# Patient Record
Sex: Female | Born: 1935
Health system: Southern US, Community
[De-identification: ages and names within clinical notes are randomized; demographics above are authoritative.]

## PROBLEM LIST (undated history)

## (undated) DIAGNOSIS — I701 Atherosclerosis of renal artery: Secondary | ICD-10-CM

## (undated) DIAGNOSIS — R002 Palpitations: Secondary | ICD-10-CM

## (undated) DIAGNOSIS — I10 Essential (primary) hypertension: Secondary | ICD-10-CM

## (undated) DIAGNOSIS — Z9289 Personal history of other medical treatment: Secondary | ICD-10-CM

## (undated) DIAGNOSIS — I493 Ventricular premature depolarization: Secondary | ICD-10-CM

## (undated) DIAGNOSIS — R011 Cardiac murmur, unspecified: Secondary | ICD-10-CM

## (undated) HISTORY — DX: Palpitations: R00.2

## (undated) HISTORY — DX: Atherosclerosis of renal artery: I70.1

## (undated) HISTORY — DX: Ventricular premature depolarization: I49.3

## (undated) HISTORY — DX: Cardiac murmur, unspecified: R01.1

## (undated) HISTORY — DX: Personal history of other medical treatment: Z92.89

## (undated) HISTORY — PX: ROTATOR CUFF REPAIR: SHX139

## (undated) HISTORY — DX: Essential (primary) hypertension: I10

## (undated) HISTORY — PX: CARDIAC CATHETERIZATION: SHX172

---

## 1986-08-28 HISTORY — PX: TUBAL LIGATION: SHX77

## 1986-08-28 HISTORY — PX: CHOLECYSTECTOMY OPEN: SUR202

## 2004-11-25 ENCOUNTER — Ambulatory Visit: Payer: Self-pay | Admitting: *Deleted

## 2004-11-25 ENCOUNTER — Ambulatory Visit (HOSPITAL_COMMUNITY): Admission: RE | Admit: 2004-11-25 | Discharge: 2004-11-25 | Payer: Self-pay | Admitting: *Deleted

## 2006-03-12 IMAGING — CR DG CHEST 2V
2 series · 2 of 2 positions shown · non-contrast
Comparison: None in our system.

CLINICAL DATA: Abnormal cardiac stress test, chest pain.

[view not recorded (1 of 2)]
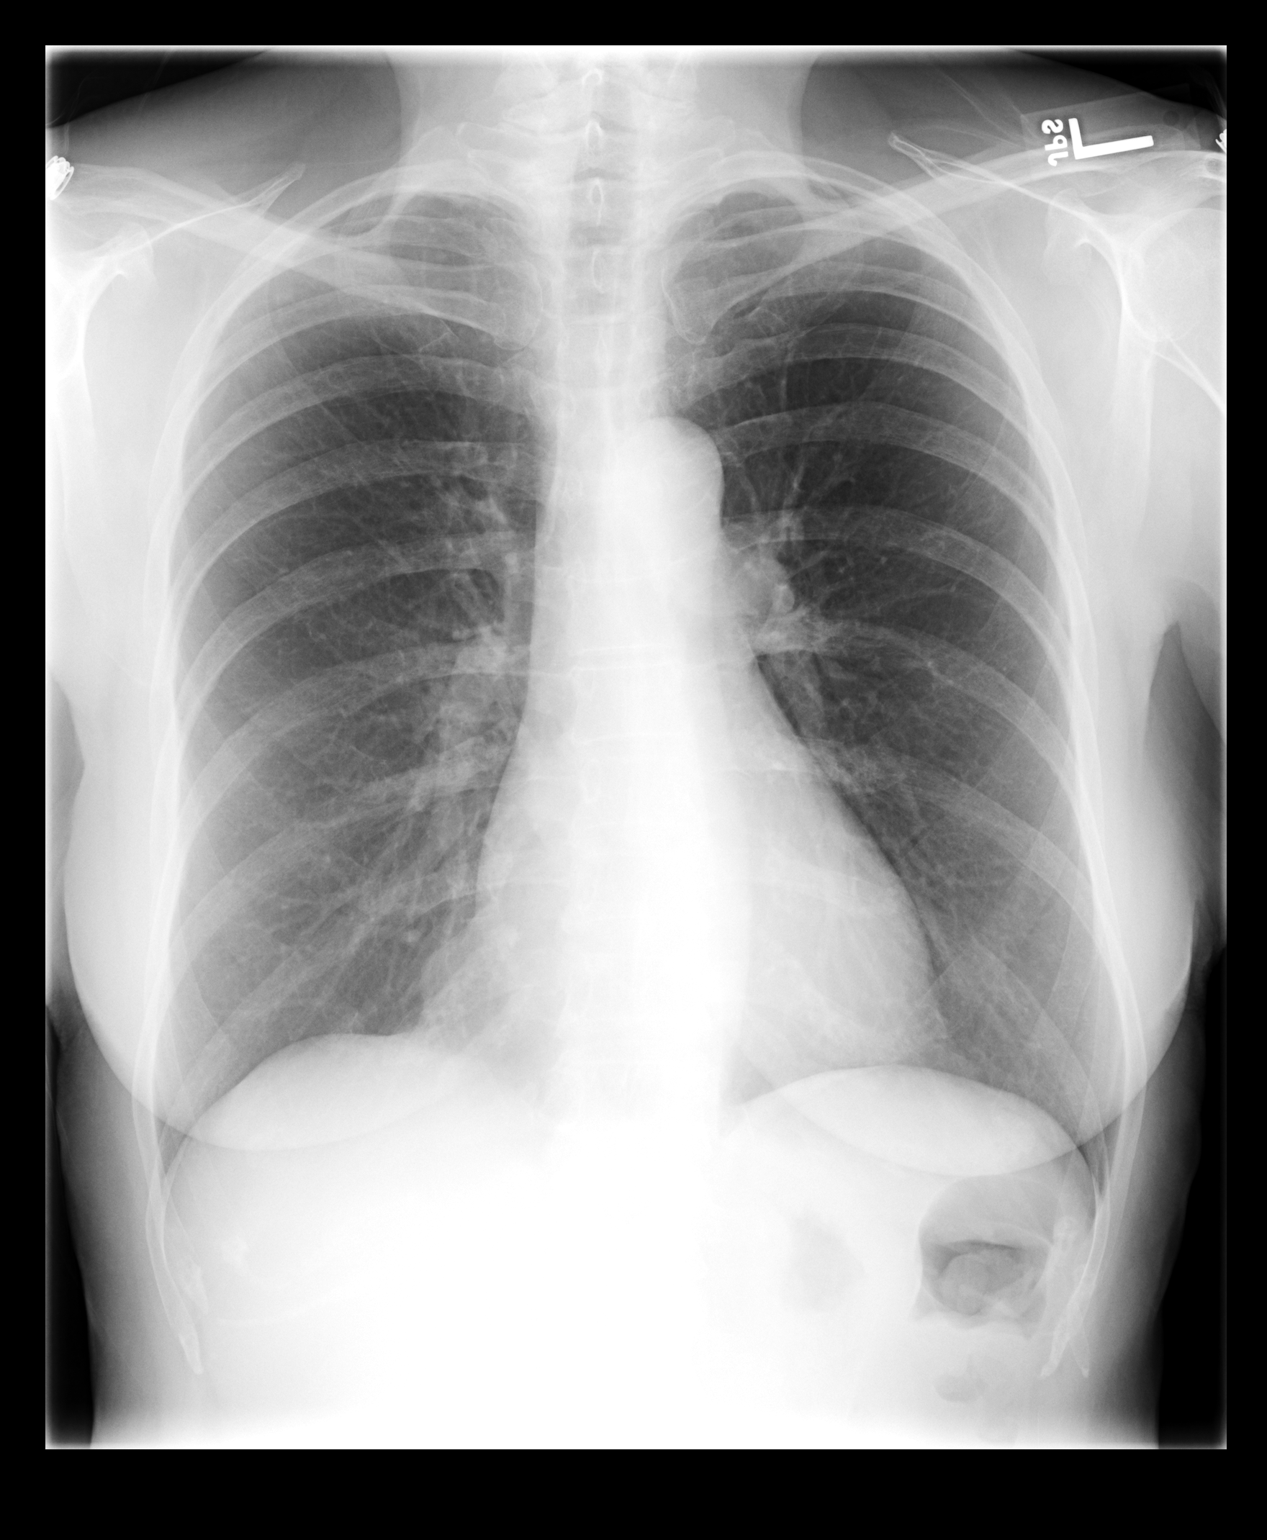

[view not recorded (2 of 2)]
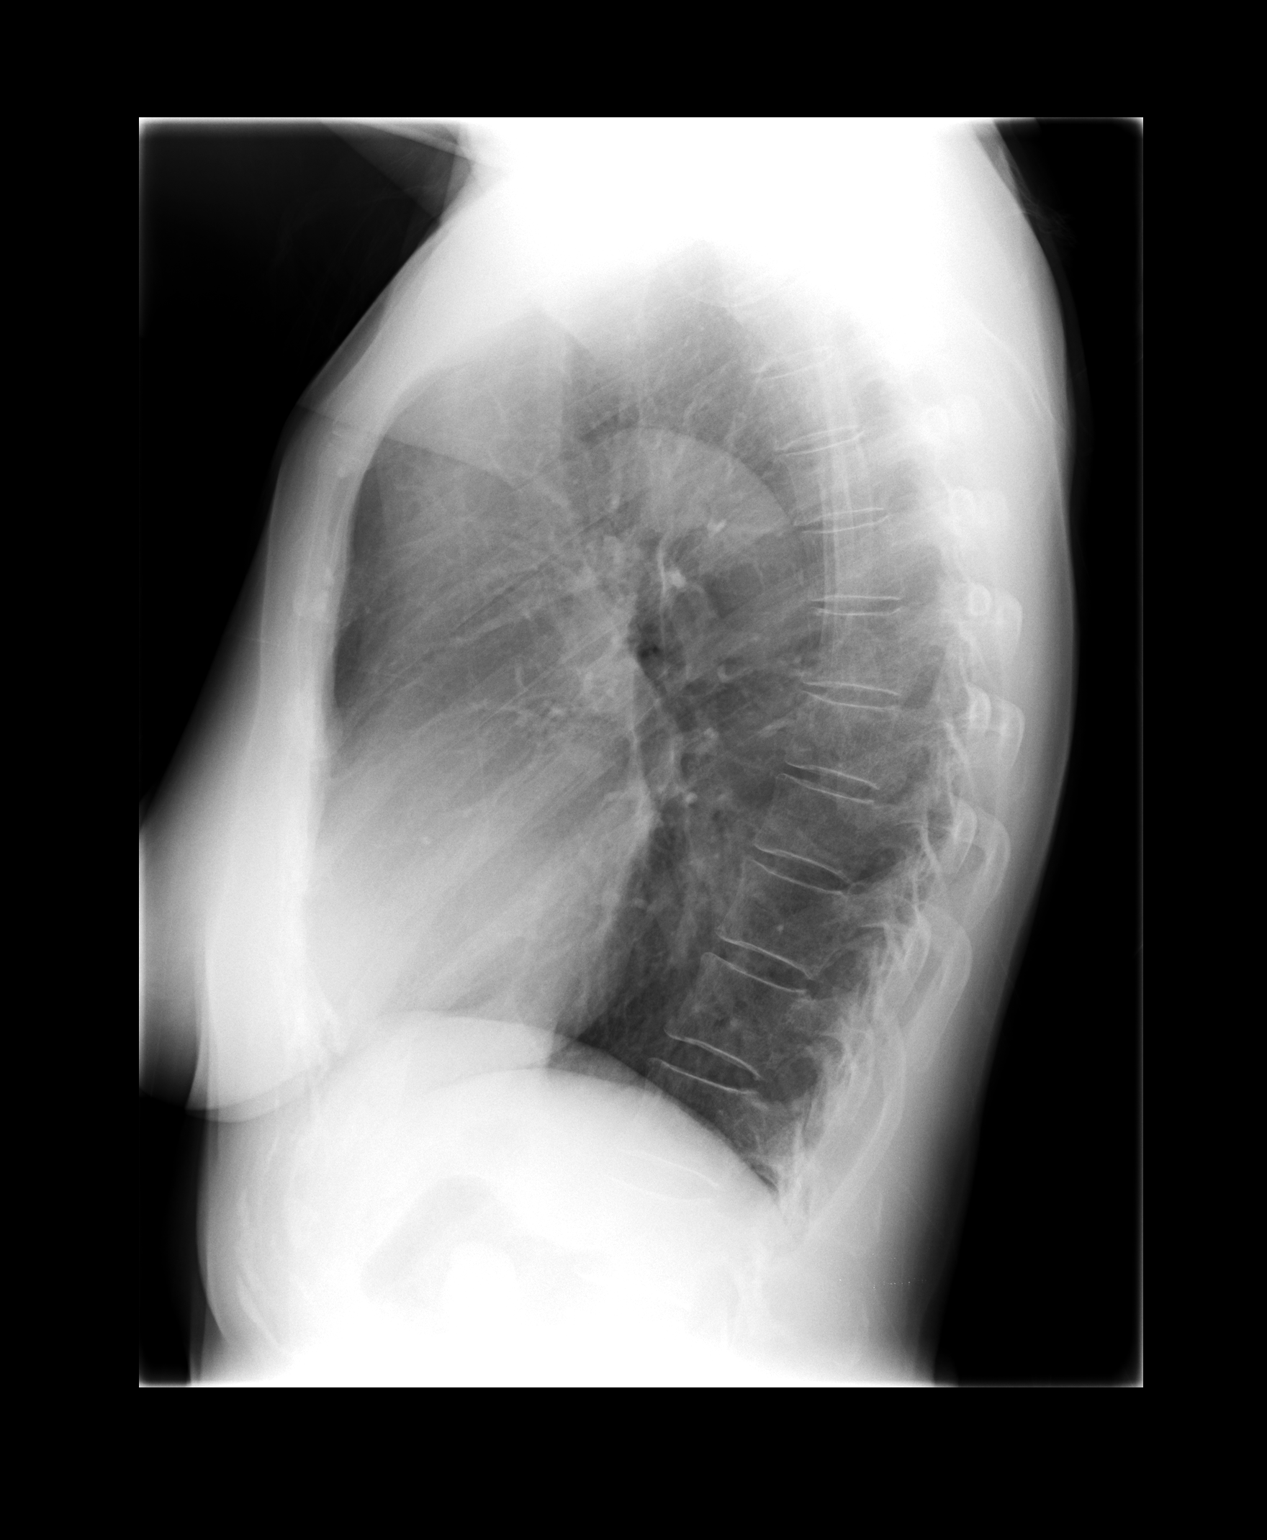

[2 of 2 positions shown; findings below may reference images not displayed]

CHEST - 2 VIEWS:
 There is an abnormal density at the right pericardiophrenic angle with convex borders to the right.  There is no corresponding abnormality on the lateral view although it may be obscured by the cardiac silhouette.  The heart is normal in size.  The lungs are clear.  No pneumothoraces or effusions are seen.
IMPRESSION: Abnormal mediastinal contour at the right pericardiophrenic angle.  Mediastinal mass is not excluded.  A comparison with prior films would be helpful.  If none are available, CT is recommended.

## 2007-08-29 HISTORY — PX: CYSTOCELE REPAIR: SHX163

## 2007-08-29 HISTORY — PX: VAGINAL HYSTERECTOMY: SUR661

## 2009-08-28 HISTORY — PX: BREAST SURGERY: SHX581

## 2010-10-10 ENCOUNTER — Ambulatory Visit (INDEPENDENT_AMBULATORY_CARE_PROVIDER_SITE_OTHER): Payer: Medicare Other | Admitting: Cardiovascular Disease

## 2010-10-10 DIAGNOSIS — I10 Essential (primary) hypertension: Secondary | ICD-10-CM

## 2010-10-10 DIAGNOSIS — I4949 Other premature depolarization: Secondary | ICD-10-CM

## 2010-10-10 DIAGNOSIS — R002 Palpitations: Secondary | ICD-10-CM

## 2010-11-09 ENCOUNTER — Encounter: Payer: Self-pay | Admitting: Cardiovascular Disease

## 2010-11-18 NOTE — Letter (Signed)
October 10, 2010   Dr. Feliciana Rossetti [address] [place], [place]  [address]  RE:  Stefanie, Ferguson MRN:  [id number]  /  DOB:  1935-08-03  Dear Dr. Shary Decamp:  I had the pleasure of seeing Stefanie Ferguson in the cardiology clinic to establish as a new patient.  She is transferring her care from [facility].  She is a pleasant 76 year old female with the following problem list: 1. History of symptomatic premature ventricular contractions with     palpitations. 2. Previous chest pain with normal coronary angiography more than 5     years ago. 3. Hypertension. 4. Renal artery stenosis being treated medically.  HISTORY OF PRESENT ILLNESS:  The patient is here today to establish cardiovascular care.  Overall she has been doing well recently.  She has a prolonged history of palpitations which was thought to be due to PVCs. There has been no documented atrial fibrillation.  She has been treated with a beta-blocker with acebutolol and overall has been doing reasonably well.  Her current palpitations are not frequent.  There has been no syncope or presyncope.  She denies any chest pain or dyspnea. She continues to be very active and has no significant physical limitations.  She does have hypertension but overall her blood pressure has been reasonably controlled with current medications.  She did not take her morning medications yet and that probably explains her high reading now.  MEDICATIONS: 1. Multivitamin once daily. 2. Hydrochlorothiazide 25 mg once daily. 3. Lisinopril 20 mg half a tablet twice daily. 4. Acebutolol 200 mg once daily. 5. Aspirin 325 mg once daily. 6. Prempro 0.3 mg once daily. 7. Nasonex nasal spray.  ALLERGIES:  No known drug allergies.  SOCIAL HISTORY:  Negative for smoking, alcohol or recreational drug use. The patient is widowed and lives by herself.  She is a retired [employer] fund[employer].  She exercises by walking.  PAST SURGICAL HISTORY:  Includes cholecystectomy,  tubal ligation, rotator cuff repair, cystocele resection, vaginal hysterectomy, resection of adenoma in the breast.  FAMILY HISTORY:  There is no family history of premature coronary artery disease.  Father died at the age of 77 from thoracic aortic aneurysm rupture.  Mother died at the age of 81.  REVIEW OF SYSTEMS:  This is remarkable for palpitations as outlined above.  Otherwise a full review of system was performed and is negative.  PHYSICAL EXAMINATION:  GENERAL:  The patient appears to be younger than her stated age and in no acute distress. VITAL SIGNS:  Weight is 130.4 pounds, blood pressure is 151/85, pulse is 60, oxygen saturation is 97% on room air. HEENT:  Normocephalic, atraumatic. NECK:  No JVD or carotid bruits. RESPIRATORY:  Normal respiratory effort with no use of accessory muscles.  Auscultation reveals normal breath sounds. CARDIOVASCULAR:  Normal PMI.  Normal S1 and S2 with no gallops or murmurs.  Heart rate is slightly irregular. ABDOMEN:  Benign, nontender, nondistended. EXTREMITIES:  With no clubbing, cyanosis or edema. SKIN:  Warm and dry with no rash. PSYCHIATRIC:  She is alert, oriented x3 with normal mood and affect. MUSCULOSKELETAL:  There is normal muscle strength in the upper and lower extremities.  An electrocardiogram was performed which showed normal sinus rhythm with sinus arrhythmia.  There are nonspecific ST and T-wave changes.  IMPRESSION: 1. Palpitation seems to be due to premature ventricular contractions.     Most recent Holter monitor was done in 2009 which showed a total of     6750  beats of PVCs over 48 hours.  There was no evidence of atrial     fibrillation at that time.  Her symptoms seem to be reasonably     controlled with acebutolol which will be continued.  She is known     to have normal LV systolic function. 2. Hypertension:  Blood pressure is elevated but she has not taken her     blood pressure medications today.  According  to the patient her     home blood pressure readings are less than 140 systolic.  Thus we     will continue with current medications.  The patient will follow up     in 6 months from now or earlier if needed.   Sincerely,     Lorine Bears, MD Electronically Signed   MA/MedQ  DD: 10/10/2010  DT: 10/10/2010  Job #: [id number]

## 2011-02-23 ENCOUNTER — Encounter: Payer: Self-pay | Admitting: Cardiovascular Disease

## 2011-02-23 ENCOUNTER — Ambulatory Visit (INDEPENDENT_AMBULATORY_CARE_PROVIDER_SITE_OTHER): Payer: Medicare Other | Admitting: Cardiovascular Disease

## 2011-02-23 DIAGNOSIS — I493 Ventricular premature depolarization: Secondary | ICD-10-CM | POA: Insufficient documentation

## 2011-02-23 DIAGNOSIS — I10 Essential (primary) hypertension: Secondary | ICD-10-CM

## 2011-02-23 DIAGNOSIS — I4949 Other premature depolarization: Secondary | ICD-10-CM

## 2011-02-23 NOTE — Assessment & Plan Note (Signed)
Her symptoms are well-controlled with acebutolol. She has not had any other documented arrhythmia. Continue current treatment.

## 2011-02-23 NOTE — Patient Instructions (Signed)
Your physician recommends that you schedule a follow-up appointment in: 6 months  

## 2011-02-23 NOTE — Progress Notes (Signed)
HPI  This is a 76 year old female who is here today for a followup visit. She has history of symptomatic PVCs well-controlled with acebutolol. She also has history of hypertension with known history of renal artery stenosis noted on renal duplex study most recently in 2009. The patient has been doing reasonably well. She has mild palpitations but no tachycardia. She denies any chest pain or dyspnea. She had problems with low blood pressure recently. She cut down the dose of lisinopril. I cut down the dose of hydrochlorothiazide to 12.5 mg once daily. However, the patient stopped the medication altogether.  No Known Allergies   Current Outpatient Prescriptions on File Prior to Visit  Medication Sig Dispense Refill  . acebutolol (SECTRAL) 200 MG capsule Take 200 mg by mouth daily.        Marland Kitchen aspirin 81 MG tablet Take 81 mg by mouth daily.        Marland Kitchen estrogen, conjugated,-medroxyprogesterone (PREMPRO) 0.3-1.5 MG per tablet Take 1 tablet by mouth daily.        Marland Kitchen lisinopril (PRINIVIL,ZESTRIL) 20 MG tablet Take 10 mg by mouth daily. Take 1/2 tablet daily       . mometasone (NASONEX) 50 MCG/ACT nasal spray 2 sprays by Nasal route daily.        . Multiple Vitamin (MULTIVITAMIN) capsule Take 1 capsule by mouth daily.        . hydrochlorothiazide 25 MG tablet Take 12.5 mg by mouth daily.          Past Medical History  Diagnosis Date  . Chest pain     normal coronary agniography in 2006 or 2007  . Symptomatic PVCs     with palpitations  . Heart palpitations     caused by PVC's  . Heart murmur   . Premature ventricular contractions   . Renal artery stenosis   . Hypertension      Past Surgical History  Procedure Date  . Cardiac catheterization   . Rotator cuff repair 1996/2000    bilateral  . Cystocele repair 2009    and Rectocele  . Vaginal hysterectomy 2009  . Cholecystectomy open 1988  . Tubal ligation 1988  . Breast surgery 2011    excision papilloma left breast     Family History    Problem Relation Age of Onset  . Aneurysm Father 50    thoracic aortic aneurysm     History   Social History  . Marital Status: Widowed    Spouse Name: N/A    Number of Children: N/A  . Years of Education: N/A   Occupational History  . Not on file.   Social History Main Topics  . Smoking status: Never Smoker   . Smokeless tobacco: Not on file  . Alcohol Use: No  . Drug Use: No  . Sexually Active:    Other Topics Concern  . Not on file   Social History Narrative  . No narrative on file       PHYSICAL EXAM   BP 151/84  Pulse 93  Ht 5\' 4"  (1.626 m)  Wt 131 lb (59.421 kg)  BMI 22.49 kg/m2  SpO2 96%  Constitutional: She is oriented to person, place, and time. She appears well-developed and well-nourished. No distress.  HENT: No nasal discharge.  Head: Normocephalic and atraumatic.  Eyes: Pupils are equal, round, and reactive to light. Right eye exhibits no discharge. Left eye exhibits no discharge.  Neck: Normal range of motion. Neck supple. No JVD present. No  thyromegaly present.  Cardiovascular: Normal rate, regular rhythm with premature beats., normal heart sounds and intact distal pulses. Exam reveals no gallop and no friction rub.  No murmur heard.  Pulmonary/Chest: Effort normal and breath sounds normal. No stridor. No respiratory distress. She has no wheezes. She has no rales. She exhibits no tenderness.  Abdominal: Soft. Bowel sounds are normal. She exhibits no distension. There is no tenderness. There is no rebound and no guarding.  Musculoskeletal: Normal range of motion. She exhibits no edema and no tenderness.  Neurological: She is alert and oriented to person, place, and time. Coordination normal.  Skin: Skin is warm and dry. No rash noted. She is not diaphoretic. No erythema. No pallor.  Psychiatric: She has a normal mood and affect. Her behavior is normal. Judgment and thought content normal.      ASSESSMENT AND PLAN

## 2011-02-23 NOTE — Assessment & Plan Note (Signed)
Her blood pressure is elevated today. I asked her to resume hydrochlorothiazide 12.5 mg once daily. Continue lisinopril and acebutolol. She will continue to monitor her blood pressure at home. She is going to obtain a new blood pressure cuff. If her blood pressure gets controlled without any evidence of chronic kidney disease, I don't see an indication to follow up on renal artery stenosis.

## 2011-04-20 ENCOUNTER — Ambulatory Visit: Payer: Medicare Other | Admitting: Cardiovascular Disease

## 2011-04-24 ENCOUNTER — Encounter: Payer: Self-pay | Admitting: Cardiovascular Disease

## 2011-04-24 ENCOUNTER — Ambulatory Visit (INDEPENDENT_AMBULATORY_CARE_PROVIDER_SITE_OTHER): Payer: Medicare Other | Admitting: Cardiovascular Disease

## 2011-04-24 DIAGNOSIS — I701 Atherosclerosis of renal artery: Secondary | ICD-10-CM

## 2011-04-24 DIAGNOSIS — I4949 Other premature depolarization: Secondary | ICD-10-CM

## 2011-04-24 DIAGNOSIS — I10 Essential (primary) hypertension: Secondary | ICD-10-CM

## 2011-04-24 DIAGNOSIS — I493 Ventricular premature depolarization: Secondary | ICD-10-CM

## 2011-04-24 MED ORDER — LISINOPRIL 20 MG PO TABS: 20.0000 mg | ORAL_TABLET | Freq: Every day | ORAL | Status: DC

## 2011-04-24 NOTE — Patient Instructions (Addendum)
Your physician recommends that you schedule a follow-up appointment in: 6 months in [facility]  Your physician has recommended you make the following change in your medication: INCREASE Lisinopril to 20 mg daily  Your physician has requested that you have a renal artery duplex. During this test, an ultrasound is used to evaluate blood flow to the kidneys. Allow one hour for this exam. Do not eat after midnight the day before and avoid carbonated beverages. Take your medications as you usually do.

## 2011-04-24 NOTE — Assessment & Plan Note (Signed)
Her blood pressure is elevated. Actually it was checked manually and it was 154/90. She is on 3 different blood pressure medications including a diuretic. I will request a renal artery duplex ultrasound given her previous history of renal artery stenosis. Regarding her medications, I will increase Lisinopril to 20 mg once daily. She is to followup in 6 months from now or earlier if needed.

## 2011-04-24 NOTE — Assessment & Plan Note (Signed)
Her symptoms are well controlled with acebutolol which will be continued. She's not having chest pain or dyspnea.

## 2011-04-24 NOTE — Progress Notes (Signed)
HPI  This is a 76 year old female who is here today for followup visit. She has a history of symptomatic PVCs well-controlled with treatment. She also has history of hypertension and previous renal artery stenosis in 2009. She has been doing reasonably well. She denies any chest pain or dyspnea. She is physically active and able to perform all activities of daily living. She had few episodes of palpitations but we are all very brief lasting for a few seconds. She did not take her blood pressure medications this morning.  No Known Allergies   Current Outpatient Prescriptions on File Prior to Visit  Medication Sig Dispense Refill  . acebutolol (SECTRAL) 200 MG capsule Take 200 mg by mouth daily.        Marland Kitchen aspirin 81 MG tablet Take 81 mg by mouth daily.        Marland Kitchen estrogen, conjugated,-medroxyprogesterone (PREMPRO) 0.3-1.5 MG per tablet Take 1 tablet by mouth daily.        . hydrochlorothiazide 25 MG tablet Take 12.5 mg by mouth daily.       . mometasone (NASONEX) 50 MCG/ACT nasal spray 2 sprays by Nasal route daily.        . Multiple Vitamin (MULTIVITAMIN) capsule Take 1 capsule by mouth daily.           Past Medical History  Diagnosis Date  . Chest pain     normal coronary agniography in 2006 or 2007  . Symptomatic PVCs     with palpitations  . Heart palpitations     caused by PVC's  . Heart murmur   . Premature ventricular contractions   . Hypertension   . Renal artery stenosis      Past Surgical History  Procedure Date  . Cardiac catheterization   . Rotator cuff repair 1996/2000    bilateral  . Cystocele repair 2009    and Rectocele  . Vaginal hysterectomy 2009  . Cholecystectomy open 1988  . Tubal ligation 1988  . Breast surgery 2011    excision papilloma left breast     Family History  Problem Relation Age of Onset  . Aneurysm Father 50    thoracic aortic aneurysm     History   Social History  . Marital Status: Widowed    Spouse Name: N/A    Number of  Children: N/A  . Years of Education: N/A   Occupational History  . Not on file.   Social History Main Topics  . Smoking status: Never Smoker   . Smokeless tobacco: Not on file  . Alcohol Use: No  . Drug Use: No  . Sexually Active:    Other Topics Concern  . Not on file   Social History Narrative  . No narrative on file     PHYSICAL EXAM   BP 148/72  Pulse 76  Ht 5\' 4"  (1.626 m)  Wt 128 lb (58.06 kg)  BMI 21.97 kg/m2  SpO2 95%  Constitutional: She is oriented to person, place, and time. She appears well-developed and well-nourished. No distress.  HENT: No nasal discharge.  Head: Normocephalic and atraumatic.  Eyes: Pupils are equal, round, and reactive to light. Right eye exhibits no discharge. Left eye exhibits no discharge.  Neck: Normal range of motion. Neck supple. No JVD present. No thyromegaly present.  Cardiovascular: Normal rate, regular rhythm, normal heart sounds and intact distal pulses. Exam reveals no gallop and no friction rub.  No murmur heard.  Pulmonary/Chest: Effort normal and breath sounds normal. No  stridor. No respiratory distress. She has no wheezes. She has no rales. She exhibits no tenderness.  Abdominal: Soft. Bowel sounds are normal. She exhibits no distension. There is no tenderness. There is no rebound and no guarding.  Musculoskeletal: Normal range of motion. She exhibits no edema and no tenderness.  Neurological: She is alert and oriented to person, place, and time. Coordination normal.  Skin: Skin is warm and dry. No rash noted. She is not diaphoretic. No erythema. No pallor.  Psychiatric: She has a normal mood and affect. Her behavior is normal. Judgment and thought content normal.      ASSESSMENT AND PLAN

## 2011-05-18 ENCOUNTER — Encounter: Payer: Medicare Other | Admitting: Cardiology

## 2011-07-06 ENCOUNTER — Encounter (INDEPENDENT_AMBULATORY_CARE_PROVIDER_SITE_OTHER): Payer: Medicare Other | Admitting: Cardiology

## 2011-07-06 DIAGNOSIS — I701 Atherosclerosis of renal artery: Secondary | ICD-10-CM

## 2011-07-06 DIAGNOSIS — I10 Essential (primary) hypertension: Secondary | ICD-10-CM

## 2011-10-27 ENCOUNTER — Ambulatory Visit (INDEPENDENT_AMBULATORY_CARE_PROVIDER_SITE_OTHER): Payer: Medicare Other | Admitting: Internal Medicine

## 2011-10-27 ENCOUNTER — Encounter: Payer: Self-pay | Admitting: Internal Medicine

## 2011-10-27 VITALS — BP 148/72 | HR 91 | Ht 64.0 in | Wt 171.0 lb

## 2011-10-27 DIAGNOSIS — I10 Essential (primary) hypertension: Secondary | ICD-10-CM

## 2011-10-27 DIAGNOSIS — I4949 Other premature depolarization: Secondary | ICD-10-CM

## 2011-10-27 DIAGNOSIS — I493 Ventricular premature depolarization: Secondary | ICD-10-CM

## 2011-10-27 MED ORDER — LISINOPRIL 20 MG PO TABS: 20.0000 mg | ORAL_TABLET | Freq: Two times a day (BID) | ORAL | Status: DC

## 2011-10-27 MED ORDER — HYDROCHLOROTHIAZIDE 25 MG PO TABS: 12.5000 mg | ORAL_TABLET | Freq: Every day | ORAL | Status: DC

## 2011-10-27 MED ORDER — ACEBUTOLOL HCL 200 MG PO CAPS: 200.0000 mg | ORAL_CAPSULE | Freq: Every day | ORAL | Status: DC

## 2011-10-27 NOTE — Progress Notes (Signed)
HPI Patient is a 77 year old wrunsho was previously followed by Terrilee Files in [place].  She has a history of  PVCs that are controlled by acebutolol.  She also has a history of HTN and renal artery FMD.   Since seen she has done OK  No CP  No signif palpitations. Had only 1 spell 3 to 4 mon ago where she felt her heart pause.  Not associated with dizzines   She has not exercised much.  BP over past week 120-140s/60-80s.     No Known Allergies  Current Outpatient Prescriptions  Medication Sig Dispense Refill  . acebutolol (SECTRAL) 200 MG capsule Take 200 mg by mouth daily.        Marland Kitchen aspirin 81 MG tablet Take 81 mg by mouth daily.        . Cyanocobalamin (VITAMIN B 12 PO) Take by mouth. TAKE 1,000 MG DAILY (1 TAB)      . hydrochlorothiazide 25 MG tablet Take 12.5 mg by mouth daily.       Marland Kitchen lisinopril (PRINIVIL,ZESTRIL) 20 MG tablet TAKE 1/2 TAB TWICE A  DAY      . mometasone (NASONEX) 50 MCG/ACT nasal spray Place 2 sprays into the nose daily. PRN      . Multiple Vitamin (MULTIVITAMIN) capsule Take 1 capsule by mouth daily.        Marland Kitchen OVER THE COUNTER MEDICATION Bone strength  CALCIUM TABS-TAKE 2 TABS DAILY      . OVER THE COUNTER MEDICATION CURAMIN  VITAMIN  AS NEEDED FOR PAIN        Past Medical History  Diagnosis Date  . Chest pain     normal coronary agniography in 2006 or 2007  . Symptomatic PVCs     with palpitations  . Heart palpitations     caused by PVC's  . Heart murmur   . Premature ventricular contractions   . Hypertension   . Renal artery stenosis     Past Surgical History  Procedure Date  . Cardiac catheterization   . Rotator cuff repair 1996/2000    bilateral  . Cystocele repair 2009    and Rectocele  . Vaginal hysterectomy 2009  . Cholecystectomy open 1988  . Tubal ligation 1988  . Breast surgery 2011    excision papilloma left breast    Family History  Problem Relation Age of Onset  . Aneurysm Father 50    thoracic aortic aneurysm    History   Social  History  . Marital Status: Widowed    Spouse Name: N/A    Number of Children: N/A  . Years of Education: N/A   Occupational History  . Not on file.   Social History Main Topics  . Smoking status: Never Smoker   . Smokeless tobacco: Not on file  . Alcohol Use: No  . Drug Use: No  . Sexually Active:    Other Topics Concern  . Not on file   Social History Narrative  . No narrative on file    Review of Systems:  All systems reviewed.  They are negative to the above problem except as previously stated. Last LDL100, HDL 53  Vital Signs: BP 148/72  Pulse 91  Ht 5\' 4"  (1.626 m)  Wt 171 lb (77.565 kg)  BMI 29.35 kg/m2  Physical Exam  Patient is in NAD.  HEENT:  Normocephalic, atraumatic. EOMI, PERRLA.  Neck: JVP is normal. No thyromegaly. No bruits.  Lungs: clear to auscultation. No rales no  wheezes.  Heart: Regular rate and rhythm. Normal S1, S2. No S3.   No significant murmurs. PMI not displaced.  Abdomen:  Supple, nontender. Normal bowel sounds. No masses. No hepatomegaly.  Extremities:   Good distal pulses throughout. No lower extremity edema.  Musculoskeletal :moving all extremities.  Neuro:   alert and oriented x3.  CN II-XII grossly intact.  EKG:  SR.  91.  Nonspecific ST T wave changes.  QT 480.    Assessment and Plan:

## 2011-10-27 NOTE — Assessment & Plan Note (Signed)
BP is fairly well controlled  I would keep on same regimen.

## 2011-10-27 NOTE — Assessment & Plan Note (Signed)
Patient is doing well on current regimen.  I would not change.  Encouraged her to increase his activity.

## 2011-10-27 NOTE — Patient Instructions (Signed)
Your physician wants you to follow-up in:  November 2013 You will receive a reminder letter in the mail two months in advance. If you don't receive a letter, please call our office to schedule the follow-up appointment.  

## 2011-11-10 ENCOUNTER — Other Ambulatory Visit: Payer: Self-pay | Admitting: *Deleted

## 2011-11-10 DIAGNOSIS — I10 Essential (primary) hypertension: Secondary | ICD-10-CM

## 2011-11-10 MED ORDER — ACEBUTOLOL HCL 200 MG PO CAPS: 200.0000 mg | ORAL_CAPSULE | Freq: Every day | ORAL | Status: DC

## 2011-11-10 MED ORDER — HYDROCHLOROTHIAZIDE 25 MG PO TABS: 12.5000 mg | ORAL_TABLET | Freq: Every day | ORAL | Status: DC

## 2011-11-14 ENCOUNTER — Telehealth: Payer: Self-pay | Admitting: Internal Medicine

## 2011-11-14 NOTE — Telephone Encounter (Signed)
Pt needs linsinopril, hctz, and [facility]ol [facility] , was to be called in at last visit, still not there

## 2011-11-15 NOTE — Telephone Encounter (Signed)
Refill were sent in I refaxed them again

## 2011-11-27 ENCOUNTER — Other Ambulatory Visit: Payer: Self-pay | Admitting: Internal Medicine

## 2011-11-27 DIAGNOSIS — I10 Essential (primary) hypertension: Secondary | ICD-10-CM

## 2011-11-27 NOTE — Telephone Encounter (Signed)
Hctz, lisiopril, acebutolol were called in to [facility] and they called pt to say they needed more info in order to refill , pls call

## 2011-12-01 ENCOUNTER — Other Ambulatory Visit: Payer: Self-pay

## 2011-12-01 MED ORDER — ACEBUTOLOL HCL 200 MG PO CAPS: 200.0000 mg | ORAL_CAPSULE | Freq: Every day | ORAL | Status: DC

## 2011-12-01 MED ORDER — LISINOPRIL 20 MG PO TABS: 20.0000 mg | ORAL_TABLET | Freq: Two times a day (BID) | ORAL | Status: DC

## 2011-12-01 MED ORDER — HYDROCHLOROTHIAZIDE 25 MG PO TABS: 12.5000 mg | ORAL_TABLET | Freq: Every day | ORAL | Status: DC

## 2011-12-01 NOTE — Telephone Encounter (Signed)
FU Call: Pt calling to check on status of refill request. Pt needs nurse/MD to call [facility] to discuss pt medications. Pt stated she will be out of medication on Monday and therefore needs RX called in today.   Pt stated that considering the time pt wants to know if we can call in RX for 4 tablets of acebutolol into [facility]  In [place] [phone]

## 2011-12-01 NOTE — Telephone Encounter (Signed)
I [facility]-the reason the meds were not being filed because he status at this time was inactive. PT was notfied to call [facility] To reopen account. Until then I called in  Two week supply of acebutolol 200mg  into [facility] . Pt was notified and encouraged to call back when mail order was reinstated so when can refill her meds in a timely manor. PT agreed.

## 2011-12-06 ENCOUNTER — Other Ambulatory Visit: Payer: Self-pay | Admitting: Internal Medicine

## 2011-12-06 DIAGNOSIS — I10 Essential (primary) hypertension: Secondary | ICD-10-CM

## 2011-12-06 MED ORDER — LISINOPRIL 20 MG PO TABS: 20.0000 mg | ORAL_TABLET | Freq: Two times a day (BID) | ORAL | Status: DC

## 2011-12-06 MED ORDER — ACEBUTOLOL HCL 200 MG PO CAPS: 200.0000 mg | ORAL_CAPSULE | Freq: Every day | ORAL | Status: DC

## 2011-12-06 MED ORDER — HYDROCHLOROTHIAZIDE 25 MG PO TABS: 12.5000 mg | ORAL_TABLET | Freq: Every day | ORAL | Status: DC

## 2011-12-06 NOTE — Telephone Encounter (Signed)
Please let her know when complete

## 2012-06-20 ENCOUNTER — Telehealth: Payer: Self-pay | Admitting: Internal Medicine

## 2012-06-20 DIAGNOSIS — R0989 Other specified symptoms and signs involving the circulatory and respiratory systems: Secondary | ICD-10-CM

## 2012-06-20 NOTE — Telephone Encounter (Signed)
Set up for carotid USN

## 2012-06-20 NOTE — Telephone Encounter (Addendum)
Called patient back. She states that she saw Alona Bene (PA for Dr.Grisso) and was told that she might have a blockage in her right carotid artery and needed to set up a carotid ultrasound. She will call and have last office note faxed to Dr.Ross. Advised will ask Dr.Ross about setting up carotid ultrasound and call her back.

## 2012-06-20 NOTE — Telephone Encounter (Signed)
New Problem:    Patient called in because her PCP PA in [place] said that she needs to have a Carotid doppler. Please call back.

## 2012-06-20 NOTE — Telephone Encounter (Signed)
Carotid ultrasound ordered per Dr.Ross. Order sent to [facility]. Patient aware.

## 2012-07-01 ENCOUNTER — Encounter (INDEPENDENT_AMBULATORY_CARE_PROVIDER_SITE_OTHER): Payer: Medicare Other

## 2012-07-01 DIAGNOSIS — R0989 Other specified symptoms and signs involving the circulatory and respiratory systems: Secondary | ICD-10-CM

## 2012-07-05 ENCOUNTER — Encounter: Payer: Self-pay | Admitting: Internal Medicine

## 2012-07-05 ENCOUNTER — Telehealth: Payer: Self-pay | Admitting: Internal Medicine

## 2012-07-05 ENCOUNTER — Ambulatory Visit (INDEPENDENT_AMBULATORY_CARE_PROVIDER_SITE_OTHER): Payer: Medicare Other | Admitting: Internal Medicine

## 2012-07-05 VITALS — BP 140/80 | HR 66 | Ht 64.0 in | Wt 127.0 lb

## 2012-07-05 DIAGNOSIS — I7789 Other specified disorders of arteries and arterioles: Secondary | ICD-10-CM

## 2012-07-05 DIAGNOSIS — I773 Arterial fibromuscular dysplasia: Secondary | ICD-10-CM

## 2012-07-05 DIAGNOSIS — I10 Essential (primary) hypertension: Secondary | ICD-10-CM

## 2012-07-05 DIAGNOSIS — E7800 Pure hypercholesterolemia, unspecified: Secondary | ICD-10-CM

## 2012-07-05 DIAGNOSIS — R350 Frequency of micturition: Secondary | ICD-10-CM

## 2012-07-05 LAB — URINALYSIS, ROUTINE W REFLEX MICROSCOPIC
Nitrite: POSITIVE
Specific Gravity, Urine: 1.01 (ref 1.000–1.030)
Total Protein, Urine: NEGATIVE
Urine Glucose: NEGATIVE
pH: 6 (ref 5.0–8.0)

## 2012-07-05 MED ORDER — ATORVASTATIN CALCIUM 10 MG PO TABS: 10.0000 mg | ORAL_TABLET | Freq: Every day | ORAL | Status: DC

## 2012-07-05 MED ORDER — CIPROFLOXACIN HCL 250 MG PO TABS: 250.0000 mg | ORAL_TABLET | Freq: Two times a day (BID) | ORAL | Status: DC

## 2012-07-05 NOTE — Patient Instructions (Addendum)
Schedule fasting lab work in 8 weeks.  Your physician has requested that you have a renal artery duplex. During this test, an ultrasound is used to evaluate blood flow to the kidneys. Allow one hour for this exam. Do not eat after midnight the day before and avoid carbonated beverages. Take your medications as you usually do.    Urine test today We will call you with results.  Start Lipitor 10mg   One half tab every evening.  Your physician wants you to follow-up in: 12 months You will receive a reminder letter in the mail two months in advance. If you don't receive a letter, please call our office to schedule the follow-up appointment.

## 2012-07-05 NOTE — Telephone Encounter (Signed)
error 

## 2012-07-05 NOTE — Progress Notes (Signed)
HPI Patient is a 77 year old who I saw back in March.  She has a history of HTN, FMD of renal arteries, PVCs.  She is followed by Dr. Shary Decamp She recently had a carotid USN done that showed no cholesterol plaquing  She did have some flow accelerations/tortusity consistent with mild FMD of carotids. Since seen the patient denies SOB  No CP.  No sigif palpitations. Not on File  Current Outpatient Prescriptions  Medication Sig Dispense Refill  . acebutolol (SECTRAL) 200 MG capsule Take 1 capsule (200 mg total) by mouth daily.  90 capsule  3  . aspirin 81 MG tablet Take 81 mg by mouth daily.        . Cyanocobalamin (VITAMIN B 12 PO) Take by mouth. TAKE 1,000 MG DAILY (1 TAB)      . fish oil-omega-3 fatty acids 1000 MG capsule Take 1 tab bid      . hydrochlorothiazide (HYDRODIURIL) 25 MG tablet Take 0.5 tablets (12.5 mg total) by mouth daily.  90 tablet  3  . lisinopril (PRINIVIL,ZESTRIL) 20 MG tablet Take 1 tablet (20 mg total) by mouth 2 (two) times daily. TAKE 1/2 TAB TWICE A  DAY  90 tablet  3  . OVER THE COUNTER MEDICATION Bone strength  CALCIUM TABS-TAKE 2 TABS DAILY        Past Medical History  Diagnosis Date  . Chest pain     normal coronary agniography in 2006 or 2007  . Symptomatic PVCs     with palpitations  . Heart palpitations     caused by PVC's  . Heart murmur   . Premature ventricular contractions   . Hypertension   . Renal artery stenosis     Past Surgical History  Procedure Date  . Cardiac catheterization   . Rotator cuff repair 1996/2000    bilateral  . Cystocele repair 2009    and Rectocele  . Vaginal hysterectomy 2009  . Cholecystectomy open 1988  . Tubal ligation 1988  . Breast surgery 2011    excision papilloma left breast    Family History  Problem Relation Age of Onset  . Aneurysm Father 50    thoracic aortic aneurysm    History   Social History  . Marital Status: Widowed    Spouse Name: N/A    Number of Children: N/A  . Years of Education:  N/A   Occupational History  . Not on file.   Social History Main Topics  . Smoking status: Never Smoker   . Smokeless tobacco: Not on file  . Alcohol Use: No  . Drug Use: No  . Sexually Active:    Other Topics Concern  . Not on file   Social History Narrative  . No narrative on file    Review of Systems:  All systems reviewed.  They are negative to the above problem except as previously stated.  Vital Signs: BP 140/80  Pulse 66  Ht 5\' 4"  (1.626 m)  Wt 127 lb (57.607 kg)  BMI 21.80 kg/m2  Physical Exam Patient is in NAD HEENT:  Normocephalic, atraumatic. EOMI, PERRLA.  Neck: JVP is normal.  No bruits.  Lungs: clear to auscultation. No rales no wheezes.  Heart: Regular rate and rhythm. Normal S1, S2. No S3.   No significant murmurs. PMI not displaced.  Abdomen:  Supple, nontender. Normal bowel sounds. No masses. No hepatomegaly.  Extremities:   Good distal pulses throughout. No lower extremity edema.  Musculoskeletal :moving all extremities.  Neuro:   alert and oriented x3.  CN II-XII grossly intact.  EKGSR with PACs Assessment and Plan:  1.  FMD  Continue to follow  WIll repeat renal USN as well as carotid at year intervals.    2.  HL  She has no chol plaquing but with FMD would recomm lowering cholesterol.  Would start on lipitor 5 mg.  F/U lipids in 8 wks.

## 2012-07-08 LAB — CULTURE, URINE COMPREHENSIVE: Colony Count: >=100000

## 2012-07-15 ENCOUNTER — Encounter (INDEPENDENT_AMBULATORY_CARE_PROVIDER_SITE_OTHER): Payer: Medicare Other

## 2012-07-15 DIAGNOSIS — I10 Essential (primary) hypertension: Secondary | ICD-10-CM

## 2012-07-15 DIAGNOSIS — I7789 Other specified disorders of arteries and arterioles: Secondary | ICD-10-CM

## 2012-08-29 ENCOUNTER — Other Ambulatory Visit: Payer: Medicare Other

## 2012-10-03 ENCOUNTER — Encounter: Payer: Self-pay | Admitting: Internal Medicine

## 2012-12-13 ENCOUNTER — Telehealth: Payer: Self-pay | Admitting: Internal Medicine

## 2012-12-13 NOTE — Telephone Encounter (Signed)
Spoke with pt, her last cholesterol check was normal. She is wondering if she could decrease the lipitor dosage or stop completely. She is having leg pain but thinks it is vascular related. She is scared of the lipitor because of all the bad things she has heard about statins. Explained to pt that dr Tenny Craw is not hear but will forward to her and we will call her back next week. Pt agreed with this plan.

## 2012-12-13 NOTE — Telephone Encounter (Signed)
New problem     On Lipitor 10 mg. In Jan & April. cholesterol was normal limit .    Patient wants to discontinue taken Lipitor please advise.

## 2012-12-23 NOTE — Telephone Encounter (Signed)
Patient's LDL on no meds was 123.  With fibromuscular dysplasia I had wanted to lower to prevent plaque formation in settin of FMD She could decrease to 2.5 and follow in 12 wks She could stop  Work on diet.  Recheck lipid panel then in 5 months.

## 2012-12-26 ENCOUNTER — Other Ambulatory Visit: Payer: Self-pay | Admitting: *Deleted

## 2012-12-26 DIAGNOSIS — E785 Hyperlipidemia, unspecified: Secondary | ICD-10-CM

## 2012-12-26 NOTE — Telephone Encounter (Signed)
Advised pt ok to stop Lipitor.  Must come in for fasting lipids in 5 months.  Pt agreed.

## 2012-12-31 ENCOUNTER — Other Ambulatory Visit: Payer: Self-pay

## 2012-12-31 DIAGNOSIS — I10 Essential (primary) hypertension: Secondary | ICD-10-CM

## 2012-12-31 MED ORDER — LISINOPRIL 20 MG PO TABS: 20.0000 mg | ORAL_TABLET | Freq: Two times a day (BID) | ORAL | Status: DC

## 2013-01-13 ENCOUNTER — Telehealth: Payer: Self-pay | Admitting: Internal Medicine

## 2013-01-13 NOTE — Telephone Encounter (Signed)
Labs ordered and faxed to [facility].

## 2013-01-13 NOTE — Telephone Encounter (Signed)
New problem   Pt is needing to have labs and need an Lab order to be faxed to [facility]. Pt doesn't have fax number but has phone number of [phone].Please call pt she also want to add something else to be checked.

## 2013-01-28 ENCOUNTER — Other Ambulatory Visit: Payer: Self-pay | Admitting: *Deleted

## 2013-01-28 DIAGNOSIS — I10 Essential (primary) hypertension: Secondary | ICD-10-CM

## 2013-01-28 MED ORDER — ACEBUTOLOL HCL 200 MG PO CAPS: 200.0000 mg | ORAL_CAPSULE | Freq: Every day | ORAL | Status: DC

## 2013-04-21 ENCOUNTER — Other Ambulatory Visit: Payer: Self-pay

## 2013-04-21 DIAGNOSIS — I10 Essential (primary) hypertension: Secondary | ICD-10-CM

## 2013-04-21 MED ORDER — ACEBUTOLOL HCL 200 MG PO CAPS: 200.0000 mg | ORAL_CAPSULE | Freq: Every day | ORAL | Status: DC

## 2013-07-11 ENCOUNTER — Encounter: Payer: Self-pay | Admitting: Cardiology

## 2013-07-11 ENCOUNTER — Ambulatory Visit (HOSPITAL_COMMUNITY): Payer: Medicare Other | Attending: Cardiology

## 2013-07-11 DIAGNOSIS — I658 Occlusion and stenosis of other precerebral arteries: Secondary | ICD-10-CM | POA: Insufficient documentation

## 2013-07-11 DIAGNOSIS — R0989 Other specified symptoms and signs involving the circulatory and respiratory systems: Secondary | ICD-10-CM | POA: Insufficient documentation

## 2013-07-11 DIAGNOSIS — I10 Essential (primary) hypertension: Secondary | ICD-10-CM | POA: Insufficient documentation

## 2013-07-11 DIAGNOSIS — Z87891 Personal history of nicotine dependence: Secondary | ICD-10-CM | POA: Insufficient documentation

## 2013-07-11 DIAGNOSIS — I6529 Occlusion and stenosis of unspecified carotid artery: Secondary | ICD-10-CM

## 2013-07-16 ENCOUNTER — Other Ambulatory Visit: Payer: Self-pay

## 2013-07-16 DIAGNOSIS — I10 Essential (primary) hypertension: Secondary | ICD-10-CM

## 2013-07-16 MED ORDER — ACEBUTOLOL HCL 200 MG PO CAPS: 200.0000 mg | ORAL_CAPSULE | Freq: Every day | ORAL | Status: DC

## 2013-07-17 ENCOUNTER — Telehealth: Payer: Self-pay | Admitting: Internal Medicine

## 2013-07-17 NOTE — Telephone Encounter (Signed)
New Problem:  Pt states she is calling for her carotid results.

## 2013-07-17 NOTE — Telephone Encounter (Signed)
Spoke with pt, aware of carotid results 

## 2013-07-18 ENCOUNTER — Ambulatory Visit (HOSPITAL_COMMUNITY): Payer: Medicare Other | Attending: Internal Medicine

## 2013-07-18 DIAGNOSIS — I739 Peripheral vascular disease, unspecified: Secondary | ICD-10-CM

## 2013-07-18 DIAGNOSIS — I701 Atherosclerosis of renal artery: Secondary | ICD-10-CM | POA: Insufficient documentation

## 2013-07-18 DIAGNOSIS — I10 Essential (primary) hypertension: Secondary | ICD-10-CM | POA: Insufficient documentation

## 2013-07-18 DIAGNOSIS — I7789 Other specified disorders of arteries and arterioles: Secondary | ICD-10-CM | POA: Insufficient documentation

## 2013-07-28 ENCOUNTER — Encounter: Payer: Self-pay | Admitting: Internal Medicine

## 2013-07-28 ENCOUNTER — Ambulatory Visit (INDEPENDENT_AMBULATORY_CARE_PROVIDER_SITE_OTHER): Payer: Medicare Other | Admitting: Internal Medicine

## 2013-07-28 VITALS — BP 146/86 | HR 68 | Ht 64.0 in | Wt 129.4 lb

## 2013-07-28 DIAGNOSIS — I7789 Other specified disorders of arteries and arterioles: Secondary | ICD-10-CM

## 2013-07-28 DIAGNOSIS — I10 Essential (primary) hypertension: Secondary | ICD-10-CM

## 2013-07-28 DIAGNOSIS — I773 Arterial fibromuscular dysplasia: Secondary | ICD-10-CM

## 2013-07-28 LAB — LIPID PANEL
Cholesterol: 159 mg/dL (ref 0–200)
HDL: 57.3 mg/dL (ref >39.00)
LDL Cholesterol: 83 mg/dL (ref 0–99)
Total CHOL/HDL Ratio: 3
Triglycerides: 92 mg/dL (ref 0.0–149.0)
VLDL: 18.4 mg/dL (ref 0.0–40.0)

## 2013-07-28 LAB — BASIC METABOLIC PANEL
Calcium: 9.6 mg/dL (ref 8.4–10.5)
Creatinine, Ser: 0.7 mg/dL (ref 0.4–1.2)
GFR: 86.35 mL/min (ref >60.00)
Glucose, Bld: 87 mg/dL (ref 70–99)
Sodium: 138 mEq/L (ref 135–145)

## 2013-07-28 LAB — CBC WITH DIFFERENTIAL/PLATELET
Basophils Relative: 0.4 % (ref 0.0–3.0)
Eosinophils Relative: 2.2 % (ref 0.0–5.0)
Hemoglobin: 12.8 g/dL (ref 12.0–15.0)
Lymphocytes Relative: 33.4 % (ref 12.0–46.0)
MCHC: 32.7 g/dL (ref 30.0–36.0)
Monocytes Absolute: 0.5 10*3/uL (ref 0.1–1.0)
Monocytes Relative: 10 % (ref 3.0–12.0)
Neutro Abs: 2.7 10*3/uL (ref 1.4–7.7)
RBC: 4.49 Mil/uL (ref 3.87–5.11)
WBC: 5 10*3/uL (ref 4.5–10.5)

## 2013-07-28 MED ORDER — HYDROCHLOROTHIAZIDE 25 MG PO TABS: 12.5000 mg | ORAL_TABLET | Freq: Every day | ORAL | Status: DC

## 2013-07-28 MED ORDER — ACEBUTOLOL HCL 200 MG PO CAPS: 200.0000 mg | ORAL_CAPSULE | Freq: Every day | ORAL | Status: DC

## 2013-07-28 MED ORDER — ATORVASTATIN CALCIUM 10 MG PO TABS: 5.0000 mg | ORAL_TABLET | Freq: Every day | ORAL | Status: DC

## 2013-07-28 MED ORDER — LISINOPRIL 20 MG PO TABS: 20.0000 mg | ORAL_TABLET | Freq: Every day | ORAL | Status: DC

## 2013-07-28 NOTE — Progress Notes (Signed)
HPI Patient is a 78 year old who I saw back in March.  She has a history of HTN, FMD of renal arteries, PVCs.  I saw her 1 year ag  Denies CP  Breathing OK  No SOB  Notes pulse is occasionally irreg Stopped lipitor even though tolerating Not walking regularly No Known Allergies  Current Outpatient Prescriptions  Medication Sig Dispense Refill  . acebutolol (SECTRAL) 200 MG capsule Take 1 capsule (200 mg total) by mouth daily.  90 capsule  0  . aspirin 81 MG tablet Take 81 mg by mouth daily.        . Cyanocobalamin (VITAMIN B 12 PO) Take by mouth. TAKE 1,000 MG DAILY (1 TAB)      . fish oil-omega-3 fatty acids 1000 MG capsule daily. Take 1 tab      . hydrochlorothiazide (HYDRODIURIL) 25 MG tablet Take 0.5 tablets (12.5 mg total) by mouth daily.  90 tablet  3  . lisinopril (PRINIVIL,ZESTRIL) 20 MG tablet Take 20 mg by mouth daily. TAKE 1/2 TAB TWICE A  DAY      . OVER THE COUNTER MEDICATION Bone strength  CALCIUM TABS-TAKE 2 TABS DAILY       No current facility-administered medications for this visit.    Past Medical History  Diagnosis Date  . Chest pain     normal coronary agniography in 2006 or 2007  . Symptomatic PVCs     with palpitations  . Heart palpitations     caused by PVC's  . Heart murmur   . Premature ventricular contractions   . Hypertension   . Renal artery stenosis     Past Surgical History  Procedure Laterality Date  . Cardiac catheterization    . Rotator cuff repair  1996/2000    bilateral  . Cystocele repair  2009    and Rectocele  . Vaginal hysterectomy  2009  . Cholecystectomy open  1988  . Tubal ligation  1988  . Breast surgery  2011    excision papilloma left breast    Family History  Problem Relation Age of Onset  . Aneurysm Father 50    thoracic aortic aneurysm    History   Social History  . Marital Status: Widowed    Spouse Name: N/A    Number of Children: N/A  . Years of Education: N/A   Occupational History  . Not on file.    Social History Main Topics  . Smoking status: Never Smoker   . Smokeless tobacco: Not on file  . Alcohol Use: No  . Drug Use: No  . Sexual Activity:    Other Topics Concern  . Not on file   Social History Narrative  . No narrative on file    Review of Systems:  All systems reviewed.  They are negative to the above problem except as previously stated.  Vital Signs: BP 146/86  Pulse 68  Ht 5\' 4"  (1.626 m)  Wt 129 lb 6.4 oz (58.695 kg)  BMI 22.20 kg/m2  Physical Exam Patient is in NAD HEENT:  Normocephalic, atraumatic. EOMI, PERRLA.  Neck: JVP is normal.  No bruits.  Lungs: clear to auscultation. No rales no wheezes.  Heart: Regular rate and rhythm. Normal S1, S2. No S3.   No significant murmurs. PMI not displaced.  Abdomen:  Supple, nontender. Normal bowel sounds. No masses. No hepatomegaly.  Extremities:   Good distal pulses throughout. No lower extremity edema.  Musculoskeletal :moving all extremities.  Neuro:  alert and oriented x3.  CN II-XII grossly intact.  EKGSR with PACs  68 bpm  Nonspecific ST T wave changes.   Assessment and Plan:  1.  FMD  Stable  2.  HL  Patient was not having problems but worried would  Stopped on own.  With FMD I would recomm lowering to prevent further vascular problems. Check lipids in 12 wks  3.  HTN  Bp fairly well controlled  I would keep on same regimen  Check labs.  4.  PVC Denies palpitations  Continue b blocker.    F/U in 1 year.

## 2013-07-28 NOTE — Patient Instructions (Addendum)
Your physician wants you to follow-up in: 1 YEAR WITH DR. ROSS You will receive a reminder letter in the mail two months in advance. If you don't receive a letter, please call our office to schedule the follow-up appointment.  Your physician recommends that you return for lab work in: TODAY (LIPIDS, CBC, BMET)  PROVIDER WOULD LIKE FOR YOUR TO GET YOUR AST AND LIPIDS CHECK WITH EMPLOYER ([facility])   Your physician has recommended you make the following change in your medication:   DECREASE YOUR LIPITOR 5 MG ONCE A DAY  WE FILLED YOUR PRESCRIPTIONS TODAY WITH YOUR [facility]IL ORDER PHARMAC[facility]

## 2013-08-28 DIAGNOSIS — Z9289 Personal history of other medical treatment: Secondary | ICD-10-CM

## 2013-08-28 HISTORY — DX: Personal history of other medical treatment: Z92.89

## 2013-10-08 ENCOUNTER — Other Ambulatory Visit: Payer: Self-pay | Admitting: *Deleted

## 2013-10-08 ENCOUNTER — Other Ambulatory Visit: Payer: Self-pay

## 2013-10-08 DIAGNOSIS — I10 Essential (primary) hypertension: Secondary | ICD-10-CM

## 2013-10-08 MED ORDER — HYDROCHLOROTHIAZIDE 25 MG PO TABS: 12.5000 mg | ORAL_TABLET | Freq: Every day | ORAL | Status: DC

## 2013-10-08 MED ORDER — ACEBUTOLOL HCL 200 MG PO CAPS: 200.0000 mg | ORAL_CAPSULE | Freq: Every day | ORAL | Status: DC

## 2013-10-08 MED ORDER — LISINOPRIL 20 MG PO TABS: 20.0000 mg | ORAL_TABLET | Freq: Every day | ORAL | Status: DC

## 2013-11-17 ENCOUNTER — Encounter: Payer: Self-pay | Admitting: Internal Medicine

## 2013-11-27 ENCOUNTER — Telehealth: Payer: Self-pay | Admitting: Internal Medicine

## 2013-11-27 NOTE — Telephone Encounter (Signed)
New message ° ° ° ° °Want blood work results °

## 2013-11-27 NOTE — Telephone Encounter (Signed)
Spoke with pt, aware of labs results

## 2013-12-02 ENCOUNTER — Telehealth: Payer: Self-pay | Admitting: Nurse Practitioner

## 2013-12-02 DIAGNOSIS — I10 Essential (primary) hypertension: Secondary | ICD-10-CM

## 2013-12-02 DIAGNOSIS — I773 Arterial fibromuscular dysplasia: Secondary | ICD-10-CM

## 2013-12-02 MED ORDER — ATORVASTATIN CALCIUM 10 MG PO TABS: ORAL_TABLET | ORAL | Status: DC

## 2013-12-02 NOTE — Telephone Encounter (Signed)
Patient aware of medication change to Lipitor 5 mg 3 x per week.  Patient states her PCP will reorder lipids in August and will forward results to [place].

## 2013-12-02 NOTE — Telephone Encounter (Signed)
Message copied by Emmaline Life on Tue Dec 02, 2013 11:13 AM ------      Message from: Fay Records      Created: Fri Nov 28, 2013 10:45 PM       Patient can take 5 mg 3x per wk.  F/U lipids in 4 months. ------

## 2013-12-31 ENCOUNTER — Telehealth: Payer: Self-pay | Admitting: Nurse Practitioner

## 2013-12-31 NOTE — Telephone Encounter (Signed)
Received call from [place] at Dr. Willette Pa office, patient's PCP, who states they have added Norvasc 5 mg in the pm to the patient's medication regimen.  Angie states patient's BP has been running 229-798 systolic and 80 diastolic.  Dr. Bea Graff wanted to make Dr. Harrington Challenger aware of this addition and would like to know if you want to see the patient or if you want them to follow.  Please call Angie at [phone] to let her know so they can arrange f/u if needed.

## 2014-01-02 NOTE — Telephone Encounter (Signed)
Per dr Harrington Challenger, will bring the pt in for bp and hr check on a days when she is in the office. We will check labs at that time. Dr Willette Pa office is closed Spoke with pt, Follow up scheduled

## 2014-01-07 ENCOUNTER — Encounter: Payer: Self-pay | Admitting: Physician Assistant

## 2014-01-07 ENCOUNTER — Ambulatory Visit (INDEPENDENT_AMBULATORY_CARE_PROVIDER_SITE_OTHER): Payer: Medicare HMO | Admitting: Physician Assistant

## 2014-01-07 VITALS — BP 124/64 | HR 76 | Ht 64.0 in | Wt 127.4 lb

## 2014-01-07 DIAGNOSIS — I10 Essential (primary) hypertension: Secondary | ICD-10-CM

## 2014-01-07 DIAGNOSIS — R002 Palpitations: Secondary | ICD-10-CM

## 2014-01-07 DIAGNOSIS — I7789 Other specified disorders of arteries and arterioles: Secondary | ICD-10-CM

## 2014-01-07 DIAGNOSIS — I773 Arterial fibromuscular dysplasia: Secondary | ICD-10-CM

## 2014-01-07 DIAGNOSIS — I701 Atherosclerosis of renal artery: Secondary | ICD-10-CM

## 2014-01-07 NOTE — Progress Notes (Signed)
[place], [place], [place]  [address] Phone: [phone] Fax:  [phone]  Date:  01/07/2014   ID:  Stefanie Ferguson, DOB 1935/05/24, MRN [id number]  PCP:  Gilford Rile, MD  Cardiologist:  Dr. Dorris Carnes      History of Present Illness: Stefanie Ferguson is a 79 y.o. female with a history of HTN, RAS, carotid stenosis, fibromuscular dysplasia, PVCs, reportedly normal coronary arteries in 2006/2007.  Patient presents today with an episode of rapid palpitations occurring for several hours yesterday. She denies any associated chest pain, dyspnea, syncope. She felt anxious. She did take an extra Acebutolol. This did not seem to help. She went to her primary care physician's office yesterday. ECG demonstrated normal sinus rhythm and no significant ST changes when compared to baseline tracings. She has never had an episode like this before. She remains quite active without exertional chest discomfort or shortness of breath. She denies orthopnea, PND or edema. She denies syncope. Recently, her lisinopril was adjusted for uncontrolled blood pressure. This did not help. She was eventually placed on amlodipine and her blood pressures have been normal since.   Studies:  - Carotid US (06/2013):  Bilateral 40-59% ICA - f/u 1 year  - Renal Art [place] (06/2013): bilateral 1-59% (c/w FMD)  Recent Labs: 07/28/2013: Creatinine 0.7; HDL Cholesterol 57.30; Hemoglobin 12.8; LDL (calc) 83; Potassium 4.7   Wt Readings from Last 3 Encounters:  01/07/14 127 lb 6.4 oz (57.788 kg)  07/28/13 129 lb 6.4 oz (58.695 kg)  07/05/12 127 lb (57.607 kg)     Past Medical History  Diagnosis Date  . Chest pain     normal coronary agniography in 2006 or 2007  . Symptomatic PVCs     with palpitations  . Heart palpitations     caused by PVC's  . Heart murmur   . Premature ventricular contractions   . Hypertension   . Renal artery stenosis     Current Outpatient Prescriptions  Medication Sig Dispense  Refill  . acebutolol (SECTRAL) 200 MG capsule Take 1 capsule (200 mg total) by mouth daily.  15 capsule  0  . amLODipine (NORVASC) 5 MG tablet Take 5 mg by mouth daily.      Marland Kitchen aspirin 81 MG tablet Take 325 mg by mouth daily.       Marland Kitchen atorvastatin (LIPITOR) 10 MG tablet Take 1/2 tab (5 mg) 3 times per week  90 tablet  1  . Cyanocobalamin (VITAMIN B 12 PO) Take by mouth. TAKE 1,000 MG DAILY (1 TAB)      . fish oil-omega-3 fatty acids 1000 MG capsule daily. Take 1 tab      . hydrochlorothiazide (HYDRODIURIL) 25 MG tablet Take 0.5 tablets (12.5 mg total) by mouth daily.  90 tablet  1  . lisinopril (PRINIVIL,ZESTRIL) 20 MG tablet Take 10 mg by mouth 2 (two) times daily. Taking 20mg  tab but cutting it in half takes one half tab morning and one half tab at night.      Marland Kitchen OVER THE COUNTER MEDICATION Bone strength  CALCIUM TABS-TAKE 2 TABS DAILY       No current facility-administered medications for this visit.    Allergies:   Review of patient's allergies indicates no known allergies.   Social History:  The patient  reports that she has never smoked. She does not have any smokeless tobacco history on file. She reports that she does not drink alcohol or use illicit drugs.   Family  History:  The patient's family history includes Aneurysm (age of onset: 22) in her father.   ROS:  Please see the history of present illness.      All other systems reviewed and negative.   PHYSICAL EXAM: VS:  BP 124/64  Pulse 76  Ht 5\' 4"  (1.626 m)  Wt 127 lb 6.4 oz (57.788 kg)  BMI 21.86 kg/m2 Well nourished, well developed, in no acute distress HEENT: normal Neck: no JVD Endocrine: No thyromegaly Cardiac:  normal S1, S2; RRR; no murmur Lungs:  clear to auscultation bilaterally, no wheezing, rhonchi or rales Abd: soft, nontender, no hepatomegaly Ext: no edema Skin: warm and dry Neuro:  CNs 2-12 intact, no focal abnormalities noted  EKG:  From her primary care physician's office yesterday demonstrates NSR with  PACs, diffuse ST changes, no change from prior tracing     ASSESSMENT AND PLAN:  1. Palpitations:  Etiology not clear. She certainly gives a good story for atrial fibrillation. CHADS2-VASc=5.  She would require anticoagulation. Continue aspirin for now. Arrange echocardiogram and event monitor. 2. Fibromuscular dysplasia: Recent renal arterial Doppler stable. Obtain followup basic metabolic panel. 3. Essential hypertension, benign: Controlled. 4. RAS (renal artery stenosis):  Recent renal arterial Dopplers stable. 5. Disposition: Followup with Dr. Harrington Challenger in 4-6 weeks.  Signed, Richardson Dopp, PA-C  01/07/2014 1:13 PM

## 2014-01-07 NOTE — Patient Instructions (Signed)
Your physician has requested that you have an echocardiogram DX 785.1, 401.1, FIBROMUSCULAR DYSPLASIA . Echocardiography is a painless test that uses sound waves to create images of your heart. It provides your doctor with information about the size and shape of your heart and how well your heart's chambers and valves are working. This procedure takes approximately one hour. There are no restrictions for this procedure.  Your physician has recommended that you wear an event monitor. Event monitors are medical devices that record the heart's electrical activity. Doctors most often [place] these monitors to diagnose arrhythmias. Arrhythmias are problems with the speed or rhythm of the heartbeat. The monitor is a small, portable device. You can wear one while you do your normal daily activities. This is usually used to diagnose what is causing palpitations/syncope (passing out).  Your physician recommends that you schedule a follow-up appointment in: 4-6 [place] DR. ROSS  YOU HAVE BEEN GIVEN AN RX FOR LAB WORK TO BE DONE WITH YOUR PRIMARY CARE PHYSICIAN ; PLEASE HAVE DOCTOR'S OFFICE FAX RESULTS TO [place], [place]  NO CHANGES WERE MADE WITH YOUR MEDICATIONS

## 2014-01-08 ENCOUNTER — Encounter: Payer: Self-pay | Admitting: *Deleted

## 2014-01-08 ENCOUNTER — Encounter (INDEPENDENT_AMBULATORY_CARE_PROVIDER_SITE_OTHER): Payer: Medicare HMO

## 2014-01-08 ENCOUNTER — Other Ambulatory Visit: Payer: Self-pay | Admitting: Physician Assistant

## 2014-01-08 DIAGNOSIS — I10 Essential (primary) hypertension: Secondary | ICD-10-CM

## 2014-01-08 DIAGNOSIS — R002 Palpitations: Secondary | ICD-10-CM

## 2014-01-08 NOTE — Progress Notes (Signed)
Patient ID: Stefanie Ferguson, female   DOB: [birth year], 79 y.o.   MRN: [id number] [employer] 30 day cardiac event monitor applied to patient.

## 2014-01-16 ENCOUNTER — Ambulatory Visit: Payer: Medicare Other | Admitting: Internal Medicine

## 2014-01-22 ENCOUNTER — Encounter: Payer: Self-pay | Admitting: Physician Assistant

## 2014-01-23 ENCOUNTER — Ambulatory Visit (HOSPITAL_COMMUNITY): Payer: Medicare HMO | Attending: Cardiology | Admitting: Cardiology

## 2014-01-23 DIAGNOSIS — I2510 Atherosclerotic heart disease of native coronary artery without angina pectoris: Secondary | ICD-10-CM | POA: Insufficient documentation

## 2014-01-23 DIAGNOSIS — I7789 Other specified disorders of arteries and arterioles: Secondary | ICD-10-CM | POA: Insufficient documentation

## 2014-01-23 DIAGNOSIS — I773 Arterial fibromuscular dysplasia: Secondary | ICD-10-CM

## 2014-01-23 DIAGNOSIS — R002 Palpitations: Secondary | ICD-10-CM | POA: Insufficient documentation

## 2014-01-23 DIAGNOSIS — I493 Ventricular premature depolarization: Secondary | ICD-10-CM

## 2014-01-23 NOTE — Progress Notes (Signed)
Echo performed. 

## 2014-01-25 ENCOUNTER — Encounter: Payer: Self-pay | Admitting: Physician Assistant

## 2014-01-27 NOTE — Progress Notes (Signed)
Quick Note:  Patient notified of ECHO results. Patient verbalized understanding and agreement with current treatment plan. Denies questions or concerns. ______

## 2014-01-30 ENCOUNTER — Telehealth: Payer: Self-pay | Admitting: Internal Medicine

## 2014-01-30 DIAGNOSIS — I4891 Unspecified atrial fibrillation: Secondary | ICD-10-CM

## 2014-01-30 NOTE — Telephone Encounter (Signed)
Follow up    Pt called saying she got a call from this office and it maybe about her Monitor.   Please give her a call back.

## 2014-01-30 NOTE — Telephone Encounter (Signed)
Agree that patient needs to be on anticoag.  Needs to call Monday to initiate.

## 2014-01-30 NOTE — Telephone Encounter (Signed)
Received report from [employer] regarding showing Afib, and sinus bradycardia.  She states she did feel heart rate being fast this AM.  BP was 90/56 HR 108.  States she didn't take her Lisinopril or HCTZ this AM.  Ate some additional salt.  She is catching a plane to [place] in 25 min.  States she is feeling better now.  Will be coming back Monday.  Advised for her to call office if has any further problems over the weekend.  Will forward to Dr. Harrington Challenger. Margaret Pyle reviewed [employer] strips and suggests that she be started on a blood thinner.  Since she is flying out of town today she needs to call Monday and will start medication at that time.  Advised pt and she will call on Monday.  She states everyone in her family has AFib.  Will forward to [facility].

## 2014-02-02 NOTE — Telephone Encounter (Signed)
Scott ,  Pt back from [place] and was instructed last week to call back to be advised as to starting a blood thinner due to A-fib. Please advise. I have the monitor still for you to look at.  Thank you Arbie Cookey    Follow up  Patient calling back to speak with nurse regarding blood thinner.            Fay Records, MD at 01/30/2014  1:33 PM      Status: Signed            Agree that patient needs to be on anticoag.  Needs to call Monday to initiate.            Hetty Blend, RN at 01/30/2014 11:41 AM      Status: Signed            Received report from [facility] regarding showing Afib, and sinus bradycardia.  She states she did feel heart rate being fast this AM.  BP was 90/56 HR 108.  States she didn't take her Lisinopril or HCTZ this AM.  Ate some additional salt.  She is catching a plane to [place] in 25 min.  States she is feeling better now.  Will be coming back Monday.  Advised for her to call office if has any further problems over the weekend.  Will forward to Dr. Harrington Challenger. Margaret Pyle reviewed [employer] strips and suggests that she be started on a blood thinner.  Since she is flying out of town today she needs to call Monday and will start medication at that time.  Advised pt and she will call on Monday.  She states everyone in her family has AFib.  Will forward to [facility].         Jim Like at 09/05/6220 11:26 AM      Status: Signed            Follow up  Pt called saying she got a call from this office and it maybe about her Monitor.   Please give her a call back

## 2014-02-02 NOTE — Telephone Encounter (Signed)
Follow up     Patient calling back to speak with nurse regarding blood thinner.

## 2014-02-03 ENCOUNTER — Telehealth: Payer: Self-pay | Admitting: Internal Medicine

## 2014-02-03 MED ORDER — APIXABAN 5 MG PO TABS: 5.0000 mg | ORAL_TABLET | Freq: Two times a day (BID) | ORAL | Status: DC

## 2014-02-03 NOTE — Telephone Encounter (Signed)
Stop ASA. Start Eliquis 5 mg BID. Get CBC and BMET in 4 weeks. Arrange appointment with [facility] clinic (new on Eliquis). Keep follow up next month. Richardson Dopp, PA-C   02/03/2014 8:05 AM

## 2014-02-03 NOTE — Telephone Encounter (Signed)
Left pt a message to call back. 

## 2014-02-03 NOTE — Telephone Encounter (Signed)
pt notified per Brynda Rim. PA/Dr. Harrington Challenger to d/c ASA, start Eliquis 5 mg BID Rx sent in today to [facility]. Pt scheduled w/[facility] new Eliquis appt, w/cbc and bmet 03/03/14 10:30. Pt has appt w/Dr. Harrington Challenger 03/09/14 4:15. Pt verbalized Plan of Care.

## 2014-02-03 NOTE — Telephone Encounter (Signed)
New message     Pt want to know if she can take off her monitor and return it.

## 2014-02-06 NOTE — Telephone Encounter (Signed)
States the monitor was due to be sent back today 6/12 and will return it today.

## 2014-02-26 ENCOUNTER — Telehealth: Payer: Self-pay | Admitting: *Deleted

## 2014-02-26 LAB — BASIC METABOLIC PANEL
BUN / CREAT RATIO: 24 (ref 11–26)
BUN: 20 mg/dL (ref 8–27)
CHLORIDE: 100 mmol/L (ref 97–108)
CO2: 25 mmol/L (ref 18–29)
Calcium: 9.3 mg/dL (ref 8.7–10.3)
Creatinine, Ser: 0.84 mg/dL (ref 0.57–1.00)
GFR calc non Af Amer: 68 mL/min/{1.73_m2} (ref >59)
GFR, EST AFRICAN AMERICAN: 78 mL/min/{1.73_m2} (ref >59)
Glucose: 153 mg/dL — ABNORMAL HIGH (ref 65–99)
Potassium: 3.8 mmol/L (ref 3.5–5.2)
SODIUM: 138 mmol/L (ref 134–144)

## 2014-02-26 NOTE — Telephone Encounter (Signed)
lmom lab results ok, f/u pcp about elevated glucose

## 2014-03-03 ENCOUNTER — Ambulatory Visit (INDEPENDENT_AMBULATORY_CARE_PROVIDER_SITE_OTHER): Payer: Medicare HMO | Admitting: Pharmacist

## 2014-03-03 ENCOUNTER — Other Ambulatory Visit (INDEPENDENT_AMBULATORY_CARE_PROVIDER_SITE_OTHER): Payer: Medicare HMO

## 2014-03-03 DIAGNOSIS — I4891 Unspecified atrial fibrillation: Secondary | ICD-10-CM

## 2014-03-03 DIAGNOSIS — Z5181 Encounter for therapeutic drug level monitoring: Secondary | ICD-10-CM

## 2014-03-03 LAB — CBC WITH DIFFERENTIAL/PLATELET
BASOS PCT: 1.4 % (ref 0.0–3.0)
Basophils Absolute: 0.1 10*3/uL (ref 0.0–0.1)
EOS ABS: 0.1 10*3/uL (ref 0.0–0.7)
Eosinophils Relative: 2.2 % (ref 0.0–5.0)
HCT: 37.3 % (ref 36.0–46.0)
Hemoglobin: 12.5 g/dL (ref 12.0–15.0)
Lymphocytes Relative: 33.4 % (ref 12.0–46.0)
Lymphs Abs: 1.3 10*3/uL (ref 0.7–4.0)
MCHC: 33.5 g/dL (ref 30.0–36.0)
MCV: 88.5 fl (ref 78.0–100.0)
Monocytes Absolute: 0.4 10*3/uL (ref 0.1–1.0)
Monocytes Relative: 10.8 % (ref 3.0–12.0)
NEUTROS PCT: 52.2 % (ref 43.0–77.0)
Neutro Abs: 2 10*3/uL (ref 1.4–7.7)
Platelets: 199 10*3/uL (ref 150.0–400.0)
RBC: 4.21 Mil/uL (ref 3.87–5.11)
RDW: 14 % (ref 11.5–15.5)
WBC: 3.8 10*3/uL — ABNORMAL LOW (ref 4.0–10.5)

## 2014-03-03 LAB — BASIC METABOLIC PANEL
BUN: 18 mg/dL (ref 6–23)
CO2: 28 mEq/L (ref 19–32)
CREATININE: 0.8 mg/dL (ref 0.4–1.2)
Calcium: 9.2 mg/dL (ref 8.4–10.5)
Chloride: 100 mEq/L (ref 96–112)
GFR: 76.09 mL/min (ref >60.00)
Glucose, Bld: 98 mg/dL (ref 70–99)
POTASSIUM: 3.6 meq/L (ref 3.5–5.1)
Sodium: 136 mEq/L (ref 135–145)

## 2014-03-03 NOTE — Progress Notes (Signed)
Pt was started on Eliquis 5 mg bid for Afib on 02/03/14.    Reviewed patients medication list.  Pt is not currently on any combined P-gp and strong CYP3A4 inhibitors/inducers (ketoconazole, traconazole, ritonavir, carbamazepine, phenytoin, rifampin, St. John's wort).  Reviewed labs.  SCr 0.8 mg/dL, Weight 58 kg, CrCl- 54 ml/min.  Dose appropriate based on age, renal function, and weight.   Hgb and HCT appropriate.  Patient notified of labs.  Patient has not had issues with cost of Eliquis.  Tolerating medication well.  She did develop a small hematoma on her right arm after hitting it on the car door, but this is resolving.    A full discussion of the nature of anticoagulants has been carried out.  A benefit/risk analysis has been presented to the patient, so that they understand the justification for choosing anticoagulation with Eliquis at this time.  The need for compliance is stressed.  Pt is aware to take the medication twice daily.  Side effects of potential bleeding are discussed, including unusual colored urine or stools, coughing up blood or coffee ground emesis, nose bleeds or serious fall or head trauma.  Discussed signs and symptoms of stroke. The patient should avoid any OTC items containing aspirin or ibuprofen.  Avoid alcohol consumption.   Call if any signs of abnormal bleeding.  Discussed financial obligations and resolved any difficulty in obtaining medication.  Next lab test test in 6 months.

## 2014-03-03 NOTE — Patient Instructions (Signed)
A full discussion of the nature of anticoagulants has been carried out.  A benefit/risk analysis has been presented to the patient, so that they understand the justification for choosing anticoagulation with Eliquis at this time.  The need for compliance is stressed.  Pt is aware to take the medication twice daily.  Side effects of potential bleeding are discussed, including unusual colored urine or stools, coughing up blood or coffee ground emesis, nose bleeds or serious fall or head trauma.  Discussed signs and symptoms of stroke. The patient should avoid any OTC items containing aspirin or ibuprofen.  Avoid alcohol consumption.   Call if any signs of abnormal bleeding.  Discussed financial obligations and resolved any difficulty in obtaining medication.  Next lab test test in 6 months (09/07/14)

## 2014-03-09 ENCOUNTER — Ambulatory Visit (INDEPENDENT_AMBULATORY_CARE_PROVIDER_SITE_OTHER): Payer: Medicare HMO | Admitting: Internal Medicine

## 2014-03-09 VITALS — BP 135/81 | HR 59 | Wt 127.0 lb

## 2014-03-09 DIAGNOSIS — I773 Arterial fibromuscular dysplasia: Secondary | ICD-10-CM

## 2014-03-09 DIAGNOSIS — I7789 Other specified disorders of arteries and arterioles: Secondary | ICD-10-CM

## 2014-03-09 MED ORDER — APIXABAN 5 MG PO TABS: 5.0000 mg | ORAL_TABLET | Freq: Two times a day (BID) | ORAL | Status: DC

## 2014-03-09 MED ORDER — ACEBUTOLOL HCL 200 MG PO CAPS: 200.0000 mg | ORAL_CAPSULE | Freq: Every day | ORAL | Status: DC

## 2014-03-09 MED ORDER — ATORVASTATIN CALCIUM 10 MG PO TABS: ORAL_TABLET | ORAL | Status: DC

## 2014-03-09 MED ORDER — AMLODIPINE BESYLATE 5 MG PO TABS: 5.0000 mg | ORAL_TABLET | Freq: Every day | ORAL | Status: DC

## 2014-03-09 NOTE — Progress Notes (Signed)
HPI History of Present Illness:  Stefanie Ferguson is a 79 y.o. female with a history of HTN, RAS, carotid stenosis, fibromuscular dysplasia, PVCs, reportedly normal coronary arteries in 2006/2007. She was seen earlier this spring BY [employer] weaver.  Complaining of palpitaitons.  Event monitor significant for atrial for Sinc then she was started on Eliquis  She is takin g2 x per day   Echo was normal.   She notes rare palpitation that is short lived.  No dizziness.  Staying active playing basketballStudies:  - Carotid US (06/2013): Bilateral 40-59% ICA - f/u 1 year  - Renal Art [place] (06/2013): bilateral 1-59% (c/w FMD) No Known Allergies  Current Outpatient Prescriptions  Medication Sig Dispense Refill  . acebutolol (SECTRAL) 200 MG capsule Take 1 capsule (200 mg total) by mouth daily.  15 capsule  0  . amLODipine (NORVASC) 5 MG tablet Take 5 mg by mouth daily.      Marland Kitchen apixaban (ELIQUIS) 5 MG TABS tablet Take 1 tablet (5 mg total) by mouth 2 (two) times daily.  60 tablet  11  . atorvastatin (LIPITOR) 10 MG tablet Take 1/2 tab (5 mg) 3 times per week  90 tablet  1  . hydrochlorothiazide (HYDRODIURIL) 25 MG tablet Take 0.5 tablets (12.5 mg total) by mouth daily.  90 tablet  1  . lisinopril (PRINIVIL,ZESTRIL) 20 MG tablet Take 10 mg by mouth 2 (two) times daily. Taking 20mg  tab but cutting it in half takes one half tab morning and one half tab at night.      Marland Kitchen OVER THE COUNTER MEDICATION Bone strength  CALCIUM TABS-TAKE 2 TABS DAILY       No current facility-administered medications for this visit.    Past Medical History  Diagnosis Date  . Chest pain     normal coronary agniography in 2006 or 2007  . Symptomatic PVCs     with palpitations  . Heart palpitations     caused by PVC's  . Heart murmur   . Premature ventricular contractions   . Hypertension   . Renal artery stenosis   . Hx of echocardiogram 2015    Echo (12/2013):  Mild LVH, EF 55%, no RWMA, Gr 1 DD, mild MR, mild LAE, normal RVF,  PASP 23 mmHg    Past Surgical History  Procedure Laterality Date  . Cardiac catheterization    . Rotator cuff repair  1996/2000    bilateral  . Cystocele repair  2009    and Rectocele  . Vaginal hysterectomy  2009  . Cholecystectomy open  1988  . Tubal ligation  1988  . Breast surgery  2011    excision papilloma left breast    Family History  Problem Relation Age of Onset  . Aneurysm Father 71    thoracic aortic aneurysm    History   Social History  . Marital Status: Widowed    Spouse Name: N/A    Number of Children: N/A  . Years of Education: N/A   Occupational History  . Not on file.   Social History Main Topics  . Smoking status: Never Smoker   . Smokeless tobacco: Not on file  . Alcohol Use: No  . Drug Use: No  . Sexual Activity:    Other Topics Concern  . Not on file   Social History Narrative  . No narrative on file    Review of Systems:  All systems reviewed.  They are negative to the above problem except as previously  stated.  Vital Signs: BP 135/81  Pulse 59  Wt 127 lb (57.607 kg)  Physical Exam PATINET IS IN nad HEENT:  Normocephalic, atraumatic. EOMI, PERRLA.  Neck: JVP is normal.  No bruits.  Lungs: clear to auscultation. No rales no wheezes.  Heart: Regular rate and rhythm. Normal S1, S2. No S3.   No significant murmurs. PMI not displaced.  Abdomen:  Supple, nontender. Normal bowel sounds. No masses. No hepatomegaly.  Extremities:   Good distal pulses throughout. No lower extremity edema.  Musculoskeletal :moving all extremities.  Neuro:   alert and oriented x3.  CN II-XII grossly intact.   Assessment and Plan: 1.  Atrial fib  Patient doing well  Has minimal symptoms.  I would keep on same regimen  Can take an extra sectral if needed. Cont Eliquis.  2HTN  Adequate control  3.  HL  Good control

## 2014-03-09 NOTE — Patient Instructions (Signed)
Your physician recommends that you continue on your current medications as directed. Please refer to the Current Medication list given to you today.  Your physician wants you to follow-up in: November  You will receive a reminder letter in the mail two months in advance. If you don't receive a letter, please call our office to schedule the follow-up appointment.

## 2014-03-31 ENCOUNTER — Telehealth: Payer: Self-pay | Admitting: Internal Medicine

## 2014-03-31 NOTE — Telephone Encounter (Signed)
°  Patient has questions AFIB? Please call and advise.

## 2014-03-31 NOTE — Telephone Encounter (Signed)
Pt called because about 1:15 to 130 pm she felt continues palpitations rate of 100 beats/minute. Pt denies any other  symptoms. Pt is concern because of the rate of 100. Pt's BP is ranging in the 120 to 130/ 60's the last BP today  was 144/82; she took her lisinopril. Pt took her heart rate at this time and it was 100 beats/minute. Pt is taking Eliquis 5 mg  as scheduled. Pt wants to know what to do next. Pt states that she has a PA in [place] where she lives; and he is very good. Pt will make an appointment to see  him this afternoon.The PA will call this office if needed. Pt states she will feel a lots better if she is seen by someone today.

## 2014-06-02 ENCOUNTER — Other Ambulatory Visit: Payer: Self-pay | Admitting: *Deleted

## 2014-06-02 DIAGNOSIS — I773 Arterial fibromuscular dysplasia: Secondary | ICD-10-CM

## 2014-06-02 MED ORDER — HYDROCHLOROTHIAZIDE 25 MG PO TABS: 12.5000 mg | ORAL_TABLET | Freq: Every day | ORAL | Status: DC

## 2014-06-02 MED ORDER — ATORVASTATIN CALCIUM 10 MG PO TABS: ORAL_TABLET | ORAL | Status: DC

## 2014-08-02 NOTE — Progress Notes (Signed)
HPI History of Present Illness:  Stefanie Ferguson is a 79 y.o. female with a history of HTN, RAS, carotid stenosis, fibromuscular dysplasia, PVCs, reportedly normal coronary arteries in 2006/2007. She was seen earlier this spring BY [employer] weaver.  Complaining of palpitaitons.  Event monitor significant for atrial for Sinc then she was started on Eliquis  She is takin g2 x per day   Echo was normal.   Studies:  - Carotid US (06/2013): Bilateral 40-59% ICA - f/u 1 year  - Renal Art [place] (06/2013): bilateral 1-59% (c/w FMD)  I saw the patient in July 2015  SInce had a few spells of palpitations  20 min  No dizziness Very active LIpids in July showed LDL ws 79  HDL 57  No Known Allergies  Current Outpatient Prescriptions  Medication Sig Dispense Refill  . acebutolol (SECTRAL) 200 MG capsule Take 1 capsule (200 mg total) by mouth daily. 90 capsule 1  . acetaminophen (TYLENOL) 325 MG tablet Take 650 mg by mouth every 6 (six) hours as needed.    Marland Kitchen amLODipine (NORVASC) 5 MG tablet Take 1 tablet (5 mg total) by mouth daily. 90 tablet 3  . amoxicillin-clavulanate (AUGMENTIN) 875-125 MG per tablet     . apixaban (ELIQUIS) 5 MG TABS tablet Take 1 tablet (5 mg total) by mouth 2 (two) times daily. 90 tablet 3  . atorvastatin (LIPITOR) 10 MG tablet Take 1/2 tab (5 mg) 3 times per week 30 tablet 0  . hydrochlorothiazide (HYDRODIURIL) 25 MG tablet Take 0.5 tablets (12.5 mg total) by mouth daily. 45 tablet 0  . lisinopril (PRINIVIL,ZESTRIL) 20 MG tablet Take 10 mg by mouth 2 (two) times daily. Taking 20mg  tab but cutting it in half takes one half tab morning and one half tab at night.    . Omega 3 1000 MG CAPS Take 1 capsule by mouth.    Marland Kitchen OVER THE COUNTER MEDICATION Bone strength  CALCIUM TABS-TAKE 2 TABS DAILY     No current facility-administered medications for this visit.    Past Medical History  Diagnosis Date  . Chest pain     normal coronary agniography in 2006 or 2007  . Symptomatic PVCs      with palpitations  . Heart palpitations     caused by PVC's  . Heart murmur   . Premature ventricular contractions   . Hypertension   . Renal artery stenosis   . Hx of echocardiogram 2015    Echo (12/2013):  Mild LVH, EF 55%, no RWMA, Gr 1 DD, mild MR, mild LAE, normal RVF, PASP 23 mmHg    Past Surgical History  Procedure Laterality Date  . Cardiac catheterization    . Rotator cuff repair  1996/2000    bilateral  . Cystocele repair  2009    and Rectocele  . Vaginal hysterectomy  2009  . Cholecystectomy open  1988  . Tubal ligation  1988  . Breast surgery  2011    excision papilloma left breast    Family History  Problem Relation Age of Onset  . Aneurysm Father 36    thoracic aortic aneurysm    History   Social History  . Marital Status: Widowed    Spouse Name: N/A    Number of Children: N/A  . Years of Education: N/A   Occupational History  . Not on file.   Social History Main Topics  . Smoking status: Never Smoker   . Smokeless tobacco: Not on file  .  Alcohol Use: No  . Drug Use: No  . Sexual Activity: Not on file   Other Topics Concern  . Not on file   Social History Narrative    Review of Systems:  All systems reviewed.  They are negative to the above problem except as previously stated.  Vital Signs: BP 118/64 mmHg  Pulse 71  Ht 5' 4.5" (1.638 m)  Wt 126 lb (57.153 kg)  BMI 21.30 kg/m2  Physical Exam PATINET IS IN nad HEENT:  Normocephalic, atraumatic. EOMI, PERRLA.  Neck: JVP is normal.  No bruits.  Lungs: clear to auscultation. No rales no wheezes.  Heart: Regular rate and rhythm. Normal S1, S2. No S3.   No significant murmurs. PMI not displaced.  Abdomen:  Supple, nontender. Normal bowel sounds. No masses. No hepatomegaly.  Extremities:   Good distal pulses throughout. No lower extremity edema.  Musculoskeletal :moving all extremities.  Neuro:   alert and oriented x3.  CN II-XII grossly intact.  EKG  SR 71  Nonspecific ST Tw e  chanes Assessment and Plan: 1.  Atrial fib  Patient doing well  Has minimal symptoms.  I would keep on same regimen  Cont eliquis  Check CBC and BMET 2HTN  Good control  She takes 1/2 or 1 lisinoprll depending on BP  3.  HL  Good control

## 2014-08-03 ENCOUNTER — Ambulatory Visit (INDEPENDENT_AMBULATORY_CARE_PROVIDER_SITE_OTHER): Payer: Medicare HMO | Admitting: Internal Medicine

## 2014-08-03 ENCOUNTER — Encounter: Payer: Self-pay | Admitting: Internal Medicine

## 2014-08-03 VITALS — BP 118/64 | HR 71 | Ht 64.5 in | Wt 126.0 lb

## 2014-08-03 DIAGNOSIS — I4891 Unspecified atrial fibrillation: Secondary | ICD-10-CM

## 2014-08-03 DIAGNOSIS — I10 Essential (primary) hypertension: Secondary | ICD-10-CM

## 2014-08-03 DIAGNOSIS — I773 Arterial fibromuscular dysplasia: Secondary | ICD-10-CM

## 2014-08-03 MED ORDER — ACEBUTOLOL HCL 200 MG PO CAPS: 200.0000 mg | ORAL_CAPSULE | Freq: Every day | ORAL | Status: DC

## 2014-08-03 MED ORDER — APIXABAN 5 MG PO TABS: 5.0000 mg | ORAL_TABLET | Freq: Two times a day (BID) | ORAL | Status: DC

## 2014-08-03 MED ORDER — LISINOPRIL 20 MG PO TABS: 10.0000 mg | ORAL_TABLET | Freq: Two times a day (BID) | ORAL | Status: DC

## 2014-08-03 MED ORDER — ATORVASTATIN CALCIUM 10 MG PO TABS: ORAL_TABLET | ORAL | Status: DC

## 2014-08-03 MED ORDER — HYDROCHLOROTHIAZIDE 25 MG PO TABS: 12.5000 mg | ORAL_TABLET | Freq: Every day | ORAL | Status: DC

## 2014-08-03 NOTE — Patient Instructions (Signed)
Your physician recommends that you return for lab work today (BMET, CBC) Your physician wants you to follow-up in: July 2016 with Dr. Harrington Challenger.  You will receive a reminder letter in the mail two months in advance. If you don't receive a letter, please call our office to schedule the follow-up appointment.

## 2014-08-10 ENCOUNTER — Encounter: Payer: Self-pay | Admitting: Internal Medicine

## 2014-09-10 ENCOUNTER — Telehealth: Payer: Self-pay | Admitting: Internal Medicine

## 2014-09-10 NOTE — Telephone Encounter (Signed)
Pt called to have labs order send to [facility] in [place]. On December 7 th  Pt was seen by MD and orders BMET and CBC; pt was not able to stayed to have labs done the so she would like to have labs send to [facility]. I spoke with [facility], I can fax the orders to the but it needs to be sign by Dr. Harrington Challenger. I will have the order ready for MD to sign and will Fax the order to [facility] ph # [phone] Fax # [phone]. Pt is aware.

## 2014-09-10 NOTE — Telephone Encounter (Signed)
New Msg        Pt needs an order to get labs completed from Dec. 7th.     Please call pt.

## 2014-09-11 NOTE — Telephone Encounter (Signed)
Dr Harrington Challenger has signed the order and I faxed it to [phone]. I did receive conformation that the fax went through successfully.

## 2014-09-23 ENCOUNTER — Other Ambulatory Visit: Payer: Self-pay | Admitting: Internal Medicine

## 2014-09-23 LAB — CBC WITH DIFFERENTIAL/PLATELET
Basophils Absolute: 0.1 10*3/uL (ref 0.0–0.2)
Basos: 1 %
EOS ABS: 0.1 10*3/uL (ref 0.0–0.4)
EOS: 2 %
HCT: 36.2 % (ref 34.0–46.6)
Hemoglobin: 12.4 g/dL (ref 11.1–15.9)
LYMPHS ABS: 1.4 10*3/uL (ref 0.7–3.1)
Lymphs: 30 %
MCH: 29.1 pg (ref 26.6–33.0)
MCHC: 34.3 g/dL (ref 31.5–35.7)
MCV: 85 fL (ref 79–97)
MONOCYTES: 13 %
MONOS ABS: 0.7 10*3/uL (ref 0.1–0.9)
NEUTROS PCT: 54 %
Neutrophils Absolute: 2.6 10*3/uL (ref 1.4–7.0)
PLATELETS: 213 10*3/uL (ref 150–379)
RBC: 4.26 x10E6/uL (ref 3.77–5.28)
RDW: 13.8 % (ref 12.3–15.4)
WBC: 4.8 10*3/uL (ref 3.4–10.8)

## 2014-09-23 LAB — BASIC METABOLIC PANEL
BUN/Creatinine Ratio: 34 — ABNORMAL HIGH (ref 11–26)
BUN: 26 mg/dL (ref 8–27)
CO2: 29 mmol/L (ref 18–29)
Calcium: 9.4 mg/dL (ref 8.7–10.3)
Chloride: 101 mmol/L (ref 97–108)
Creatinine, Ser: 0.76 mg/dL (ref 0.57–1.00)
GFR calc Af Amer: 88 mL/min/{1.73_m2} (ref >59)
GFR calc non Af Amer: 76 mL/min/{1.73_m2} (ref >59)
GLUCOSE: 92 mg/dL (ref 65–99)
Potassium: 4.1 mmol/L (ref 3.5–5.2)
SODIUM: 139 mmol/L (ref 134–144)

## 2014-10-13 ENCOUNTER — Other Ambulatory Visit: Payer: Self-pay | Admitting: Radiology

## 2014-10-13 DIAGNOSIS — I6523 Occlusion and stenosis of bilateral carotid arteries: Secondary | ICD-10-CM

## 2014-10-14 ENCOUNTER — Ambulatory Visit (HOSPITAL_COMMUNITY): Payer: Medicare HMO | Attending: Cardiology | Admitting: Cardiology

## 2014-10-14 ENCOUNTER — Ambulatory Visit (INDEPENDENT_AMBULATORY_CARE_PROVIDER_SITE_OTHER): Payer: Medicare HMO | Admitting: *Deleted

## 2014-10-14 DIAGNOSIS — I6523 Occlusion and stenosis of bilateral carotid arteries: Secondary | ICD-10-CM | POA: Insufficient documentation

## 2014-10-14 DIAGNOSIS — I4891 Unspecified atrial fibrillation: Secondary | ICD-10-CM

## 2014-10-14 NOTE — Progress Notes (Signed)
Pt was started on Eliquis for Afib  on 02/03/2014.    Reviewed patients medication list.  Pt is not  currently on any combined P-gp and strong CYP3A4 inhibitors/inducers (ketoconazole, traconazole, ritonavir, carbamazepine, phenytoin, rifampin, St. John's wort).  Reviewed labs from 09/23/14:  SCr 0.76, Weight 58Kg.  Age 80 Yrs old old. Dose appropriate based on specified criteria .   Hgb and HCT are 12.4/36.2.  A full discussion of the nature of anticoagulants has been carried out.  A benefit/risk analysis has been presented to the patient, so that they understand the justification for choosing anticoagulation with Eliquis at this time.  The need for compliance is stressed.  Pt is aware to take the medication twice daily.  Side effects of potential bleeding are discussed, including unusual colored urine or stools, coughing up blood or coffee ground emesis, nose bleeds or serious fall or head trauma.  Discussed signs and symptoms of stroke. The patient should avoid any OTC items containing aspirin or ibuprofen.  Avoid alcohol consumption.   Call if any signs of abnormal bleeding.  Discussed financial obligations and resolved any difficulty in obtaining medication.  Pt will follow up with her cardiologist as scheduled.

## 2014-10-14 NOTE — Progress Notes (Signed)
Carotid duplex performed 

## 2014-10-23 ENCOUNTER — Telehealth: Payer: Self-pay | Admitting: Internal Medicine

## 2014-10-23 NOTE — Telephone Encounter (Signed)
Forwarding to Dr. Harrington Challenger for recommendations.

## 2014-10-23 NOTE — Telephone Encounter (Signed)
New message      Request for surgical clearance:  What type of surgery is being performed? colonoscopy 1. When is this surgery scheduled?  Not scheduled  Are there any medications that need to be held prior to surgery and how long?eliquis----off for 2 days 2. Name of physician performing surgery? Dr Loma Sousa in [place]  3. What is your office phone and fax number?  Do not have number

## 2014-10-27 ENCOUNTER — Other Ambulatory Visit: Payer: Self-pay | Admitting: Internal Medicine

## 2014-10-27 NOTE — Telephone Encounter (Signed)
Patinet had normal coronary arteries in 2007 Hx afib  On eliquis  Mild CAD   I saw her in December 2015  Doing well She is a low cardiac risk  OK to proceed  What surgery is she having?

## 2014-11-26 ENCOUNTER — Other Ambulatory Visit: Payer: Self-pay | Admitting: *Deleted

## 2014-11-26 MED ORDER — AMLODIPINE BESYLATE 5 MG PO TABS: 5.0000 mg | ORAL_TABLET | Freq: Every day | ORAL | Status: DC

## 2015-01-01 ENCOUNTER — Other Ambulatory Visit: Payer: Self-pay | Admitting: *Deleted

## 2015-01-01 MED ORDER — LISINOPRIL 20 MG PO TABS: 10.0000 mg | ORAL_TABLET | Freq: Two times a day (BID) | ORAL | Status: DC

## 2015-01-15 ENCOUNTER — Telehealth: Payer: Self-pay | Admitting: Internal Medicine

## 2015-01-15 NOTE — Telephone Encounter (Signed)
Called pt but she states she has "everything taken care of"

## 2015-01-15 NOTE — Telephone Encounter (Signed)
New Message   Pt called states that she has a physical scheduled with her PCP. Requests a call back to determine what labs are needed from Dr. Harrington Challenger so that she can have them completed with her PCP. Please call

## 2015-01-26 ENCOUNTER — Encounter: Payer: Self-pay | Admitting: Internal Medicine

## 2015-02-21 ENCOUNTER — Telehealth: Payer: Self-pay | Admitting: Nurse Practitioner

## 2015-02-21 NOTE — Telephone Encounter (Signed)
   Pt called this morning reporting that she had some abd cramping earlier and has since had 2 loose stools with small amounts of BRB noted in stool.  She is not currently having any abd pain and denies lightheadedness/presyncope.  I advised that she should be evaluated today in the ED given that she is on eliquis for PAF.  She prefers to wait and see if she is has any more bloody stools.  I reiterated that she should have a low threshold to be evaluated today.  Caller verbalized understanding and was grateful for the call back.  Murray Hodgkins, NP 02/21/2015, 9:14 AM

## 2015-02-22 ENCOUNTER — Telehealth: Payer: Self-pay | Admitting: Internal Medicine

## 2015-02-22 DIAGNOSIS — I10 Essential (primary) hypertension: Secondary | ICD-10-CM

## 2015-02-22 DIAGNOSIS — K921 Melena: Secondary | ICD-10-CM

## 2015-02-22 NOTE — Telephone Encounter (Signed)
Left message to call back  

## 2015-02-22 NOTE — Telephone Encounter (Signed)
Please set up for CBC and BMET    Pt should call if bleeding recurs

## 2015-02-22 NOTE — Telephone Encounter (Signed)
New message   Patient calling    Pt C/O medication issue:  1. Name of Medication: eliquis 5 mg / Lisinopril  20 mg   2. How are you currently taking this medication (dosage and times per day)? Eliquis in am  / pm  .. Lisinopril  Was taken a night . Now   3. Are you having a reaction (difficulty breathing--STAT)? Blood in stool -  over the weekend   4. What is your medication issue? Discuss linsopril & Eliquis. Bleeding stop at 1:45 pm on yesterday.

## 2015-02-22 NOTE — Telephone Encounter (Signed)
Patient called back. Patient had bright red blood in her stool over the weekend, starting on Saturday evening. Patient also had emesis with no evidence of blood, only half digested food.  Prior to first episode patient had cramping and lightheadedness. Patient stated she had bleeding with flatus also. Last BM was Sunday at 1:45 pm with blood in stool. Patient stated that she has not seen any more blood since last BM, and no bleeding with flatus. Patient stopped her eliquis, with last dose on Sunday morning. Patient also is worried about taking lisinopril 20 mg, she believes this is too high of a dose. Patient has been taking half of her dose 10 mg. BP last night before taking lisinopril was 130/60 and BP was 120/64 this am. Patient is going to call her PCP and surgeon this morning for further instruction from a GI perspective. Will forward to Dr. Harrington Challenger for further instructions on holding eliquis and dose of lisinopril.

## 2015-02-23 ENCOUNTER — Encounter: Payer: Self-pay | Admitting: Internal Medicine

## 2015-02-23 ENCOUNTER — Other Ambulatory Visit: Payer: Self-pay | Admitting: Internal Medicine

## 2015-02-23 DIAGNOSIS — K921 Melena: Secondary | ICD-10-CM | POA: Insufficient documentation

## 2015-02-23 LAB — BASIC METABOLIC PANEL
BUN/Creatinine Ratio: 28 — ABNORMAL HIGH (ref 11–26)
BUN: 19 mg/dL (ref 8–27)
CHLORIDE: 102 mmol/L (ref 97–108)
CO2: 27 mmol/L (ref 18–29)
Calcium: 9.9 mg/dL (ref 8.7–10.3)
Creatinine, Ser: 0.68 mg/dL (ref 0.57–1.00)
GFR, EST AFRICAN AMERICAN: 97 mL/min/{1.73_m2} (ref >59)
GFR, EST NON AFRICAN AMERICAN: 84 mL/min/{1.73_m2} (ref >59)
Glucose: 94 mg/dL (ref 65–99)
Potassium: 4.2 mmol/L (ref 3.5–5.2)
Sodium: 139 mmol/L (ref 134–144)

## 2015-02-23 LAB — CBC WITH DIFFERENTIAL/PLATELET
BASOS: 2 %
Basophils Absolute: 0.1 10*3/uL (ref 0.0–0.2)
EOS (ABSOLUTE): 0.1 10*3/uL (ref 0.0–0.4)
EOS: 2 %
HEMOGLOBIN: 13.1 g/dL (ref 11.1–15.9)
Hematocrit: 38.2 % (ref 34.0–46.6)
LYMPHS: 34 %
Lymphocytes Absolute: 1.2 10*3/uL (ref 0.7–3.1)
MCH: 29.6 pg (ref 26.6–33.0)
MCHC: 34.3 g/dL (ref 31.5–35.7)
MCV: 86 fL (ref 79–97)
MONOCYTES: 13 %
Monocytes Absolute: 0.5 10*3/uL (ref 0.1–0.9)
NEUTROS PCT: 49 %
Neutrophils Absolute: 1.8 10*3/uL (ref 1.4–7.0)
Platelets: 216 10*3/uL (ref 150–379)
RBC: 4.42 x10E6/uL (ref 3.77–5.28)
RDW: 14.1 % (ref 12.3–15.4)
WBC: 3.6 10*3/uL (ref 3.4–10.8)

## 2015-02-23 NOTE — Telephone Encounter (Signed)
[facility] called back. Orders were not rec'd electronically. Faxed them to [phone]. Confirmation rec'd.

## 2015-02-23 NOTE — Telephone Encounter (Signed)
I spoke with the pt and made her aware that she can resume Eliquis at this time if she has not seen anymore blood in her stool.  The pt did have CBC and BMP drawn today around lunch time and they are pending. The pt said she will contact her PCP to arrange further follow-up.

## 2015-02-23 NOTE — Telephone Encounter (Signed)
Spoke to patient. She agrees with lab work. Requests to go to [facility] in [place] since it is closer to her address. Spoke to [facility] to advise them the order for CBC and BMET is being ordered by Dr. Harrington Challenger. Patient states she does not see anymore visible blood in her stool this morning. She has HELD Eliquis and she is only taking 10 mg Lisinopril daily. States her HR 68 and BP 130/76. Understands to call office back if bleeding recurs. Patient still has question regarding whether to hold Eliquis or not at this time. Advised her that message will be routed to Dr. Harrington Challenger.

## 2015-02-23 NOTE — Telephone Encounter (Signed)
If no more blood in stool would resume Eliquis Labs results not in yet She needs to f/u with primary MD

## 2015-02-25 ENCOUNTER — Telehealth: Payer: Self-pay | Admitting: Internal Medicine

## 2015-02-25 NOTE — Telephone Encounter (Signed)
New problem   Pt want to know if her records was sent to Dr Bea Graff office for her appt today.

## 2015-02-25 NOTE — Telephone Encounter (Signed)
Routed recent telephone encounter and labs to patient's PCP Dr. Gilford Rile.

## 2015-03-15 ENCOUNTER — Ambulatory Visit: Payer: Medicare HMO | Admitting: Internal Medicine

## 2015-03-29 NOTE — Progress Notes (Signed)
Cardiology Office Note   Date:  04/01/2015   ID:  Stefanie Ferguson, DOB 02/20/1935, MRN [id number]  PCP:  Gilford Rile, MD  Cardiologist:   Dorris Carnes, MD   No chief complaint on file.     History of Present Illness: Stefanie Ferguson is a 80 y.o. female with a history of  HTN, RAS(FMD), carotid stenosis  REported normal coronary arteries in 2007  Event monitor with atrial fib  Echo normal   I saw the pt in December  Had some bleeding earlier  None now  For colonoscopy on 8/11  Current Outpatient Prescriptions  Medication Sig Dispense Refill  . acebutolol (SECTRAL) 200 MG capsule Take 1 capsule (200 mg total) by mouth daily. 90 capsule 3  . acetaminophen (TYLENOL) 325 MG tablet Take 650 mg by mouth every 6 (six) hours as needed for mild pain.     Marland Kitchen amLODipine (NORVASC) 5 MG tablet Take 1 tablet (5 mg total) by mouth daily. 90 tablet 1  . apixaban (ELIQUIS) 5 MG TABS tablet Take 1 tablet (5 mg total) by mouth 2 (two) times daily. 90 tablet 3  . atorvastatin (LIPITOR) 10 MG tablet Take 5 mg by mouth 3 (three) times a week.    . Calcium Citrate-Vitamin D (CALCIUM + D PO) Take 1 tablet by mouth daily.    . hydrochlorothiazide (HYDRODIURIL) 25 MG tablet Take 0.5 tablets (12.5 mg total) by mouth daily. 45 tablet 3  . lisinopril (PRINIVIL,ZESTRIL) 10 MG tablet Take 10 mg by mouth 2 (two) times daily.    . Omega 3 1000 MG CAPS Take 1 capsule by mouth.    . tetrahydrozoline 0.05 % ophthalmic solution Place 2 drops into both eyes 2 (two) times daily.     No current facility-administered medications for this visit.    Allergies:   Review of patient's allergies indicates no known allergies.   Past Medical History  Diagnosis Date  . Chest pain     normal coronary agniography in 2006 or 2007  . Symptomatic PVCs     with palpitations  . Heart palpitations     caused by PVC's  . Heart murmur   . Premature ventricular contractions   . Hypertension   . Renal artery stenosis   . Hx of  echocardiogram 2015    Echo (12/2013):  Mild LVH, EF 55%, no RWMA, Gr 1 DD, mild MR, mild LAE, normal RVF, PASP 23 mmHg    Past Surgical History  Procedure Laterality Date  . Cardiac catheterization    . Rotator cuff repair  1996/2000    bilateral  . Cystocele repair  2009    and Rectocele  . Vaginal hysterectomy  2009  . Cholecystectomy open  1988  . Tubal ligation  1988  . Breast surgery  2011    excision papilloma left breast     Social History:  The patient  reports that she has never smoked. She does not have any smokeless tobacco history on file. She reports that she does not drink alcohol or use illicit drugs.   Family History:  The patient's family history includes Aneurysm (age of onset: 45) in her father; Hypertension in her mother.    ROS:  Please see the history of present illness. All other systems are reviewed and  Negative to the above problem except as noted.    PHYSICAL EXAM: VS:  BP 120/60 mmHg  Pulse 61  Ht 5' 4.5" (1.638 m)  Wt 126 lb 1.9  oz (57.208 kg)  BMI 21.32 kg/m2  GEN: Well nourished, well developed, in no acute distress HEENT: normal Neck: no JVD, carotid bruits, or masses Cardiac: RRR; no murmurs, rubs, or gallops,no edema  Respiratory:  clear to auscultation bilaterally, normal work of breathing GI: soft, nontender, nondistended, + BS  No hepatomegaly  MS: no deformity Moving all extremities   Skin: warm and dry, no rash Neuro:  Strength and sensation are intact Psych: euthymic mood, full affect   EKG:  EKG is not ordered today.   Lipid Panel    Component Value Date/Time   CHOL 159 07/28/2013 1132   TRIG 92.0 07/28/2013 1132   HDL 57.30 07/28/2013 1132   CHOLHDL 3 07/28/2013 1132   VLDL 18.4 07/28/2013 1132   LDLCALC 83 07/28/2013 1132      Wt Readings from Last 3 Encounters:  04/01/15 126 lb 1.9 oz (57.208 kg)  08/03/14 126 lb (57.153 kg)  03/09/14 127 lb (57.607 kg)      ASSESSMENT AND PLAN: 1.  Atrial fib  Continue  meds    2.  CV dz  Check lipids    3  HTN  Good control    4.  HL  Will get lipids from [facility]     F/U will be with meh me in April    Signed, Dorris Carnes, MD  04/01/2015 9:32 AM    [address], [place], [place]  [address] Phone: [phone]; Fax: [phone]

## 2015-04-01 ENCOUNTER — Encounter: Payer: Self-pay | Admitting: Internal Medicine

## 2015-04-01 ENCOUNTER — Ambulatory Visit (INDEPENDENT_AMBULATORY_CARE_PROVIDER_SITE_OTHER): Payer: Medicare HMO | Admitting: Internal Medicine

## 2015-04-01 ENCOUNTER — Telehealth: Payer: Self-pay

## 2015-04-01 VITALS — BP 120/60 | HR 61 | Ht 64.5 in | Wt 126.1 lb

## 2015-04-01 DIAGNOSIS — I10 Essential (primary) hypertension: Secondary | ICD-10-CM

## 2015-04-01 LAB — BASIC METABOLIC PANEL
BUN: 15 mg/dL (ref 6–23)
CO2: 31 meq/L (ref 19–32)
Calcium: 9.4 mg/dL (ref 8.4–10.5)
Chloride: 101 mEq/L (ref 96–112)
Creatinine, Ser: 0.69 mg/dL (ref 0.40–1.20)
GFR: 87.41 mL/min (ref >60.00)
Glucose, Bld: 107 mg/dL — ABNORMAL HIGH (ref 70–99)
POTASSIUM: 3.8 meq/L (ref 3.5–5.1)
SODIUM: 136 meq/L (ref 135–145)

## 2015-04-01 LAB — CBC WITH DIFFERENTIAL/PLATELET
BASOS ABS: 0 10*3/uL (ref 0.0–0.1)
BASOS PCT: 0.8 % (ref 0.0–3.0)
Eosinophils Absolute: 0.1 10*3/uL (ref 0.0–0.7)
Eosinophils Relative: 1.6 % (ref 0.0–5.0)
HCT: 39 % (ref 36.0–46.0)
Hemoglobin: 13.1 g/dL (ref 12.0–15.0)
Lymphocytes Relative: 24 % (ref 12.0–46.0)
Lymphs Abs: 1.3 10*3/uL (ref 0.7–4.0)
MCHC: 33.7 g/dL (ref 30.0–36.0)
MCV: 87.9 fl (ref 78.0–100.0)
MONOS PCT: 10.6 % (ref 3.0–12.0)
Monocytes Absolute: 0.6 10*3/uL (ref 0.1–1.0)
Neutro Abs: 3.5 10*3/uL (ref 1.4–7.7)
Neutrophils Relative %: 63 % (ref 43.0–77.0)
PLATELETS: 214 10*3/uL (ref 150.0–400.0)
RBC: 4.44 Mil/uL (ref 3.87–5.11)
RDW: 14.2 % (ref 11.5–15.5)
WBC: 5.6 10*3/uL (ref 4.0–10.5)

## 2015-04-01 MED ORDER — ATORVASTATIN CALCIUM 10 MG PO TABS: 5.0000 mg | ORAL_TABLET | ORAL | Status: DC

## 2015-04-01 MED ORDER — HYDROCHLOROTHIAZIDE 25 MG PO TABS: 12.5000 mg | ORAL_TABLET | Freq: Every day | ORAL | Status: DC

## 2015-04-01 NOTE — Patient Instructions (Signed)
Medication Instructions:  Your physician recommends that you continue on your current medications as directed. Please refer to the Current Medication list given to you today.   Labwork: Bmet, Cbc today  Testing/Procedures: None   Follow-Up: Your physician wants you to follow-up in: April 2017 You will receive a reminder letter in the mail two months in advance. If you don't receive a letter, please call our office to schedule the follow-up appointment.   Any Other Special Instructions Will Be Listed Below (If Applicable). We will request your recent lipid labs from Dr.Grisso's office

## 2015-04-01 NOTE — Telephone Encounter (Signed)
Requested recent copies of labs drawn by Dr.Grisso in June 2016. They will fax it over attn: Dr.Ross

## 2015-05-21 ENCOUNTER — Telehealth: Payer: Self-pay | Admitting: Internal Medicine

## 2015-05-21 NOTE — Telephone Encounter (Signed)
Called patient back. She feels good, working out in her yard, but wanted to report that sometimes she could be walking along and just gets "stumbly" "unsteady on my feet".  Checks BP daily, usually 382N systolic.  If it is in the low 100s she holds a dose of lisinopril, which she said she has discussed with Dr. Harrington Challenger in the past.  HR is 70-80s and she hasn't noticed palpitations.  She is never dizzy or light headed feeling.  She usually gets enough water to drink, if BP is low and she feels bad, she drinks more water and feels better..  I recommended she contact PCP to inform.  She is happy to do that and appreciative for the call back.

## 2015-05-21 NOTE — Telephone Encounter (Signed)
New message      Pt states she is sometimes "wobbly" on her feet.  Please advise

## 2015-05-31 ENCOUNTER — Other Ambulatory Visit: Payer: Self-pay | Admitting: *Deleted

## 2015-05-31 MED ORDER — ACEBUTOLOL HCL 200 MG PO CAPS: 200.0000 mg | ORAL_CAPSULE | Freq: Every day | ORAL | Status: DC

## 2015-05-31 MED ORDER — AMLODIPINE BESYLATE 5 MG PO TABS: 5.0000 mg | ORAL_TABLET | Freq: Every day | ORAL | Status: DC

## 2015-06-23 ENCOUNTER — Other Ambulatory Visit: Payer: Self-pay | Admitting: Internal Medicine

## 2015-07-26 ENCOUNTER — Other Ambulatory Visit: Payer: Self-pay | Admitting: Internal Medicine

## 2015-08-12 ENCOUNTER — Telehealth: Payer: Self-pay | Admitting: Internal Medicine

## 2015-08-12 NOTE — Telephone Encounter (Signed)
New message     Pt want to know which urologist Dr Harrington Challenger uses.  Her PCP want her to go to one but pt want to know who Dr Harrington Challenger recommend.  She has frequent UTI's.

## 2015-08-12 NOTE — Telephone Encounter (Signed)
Called patient and gave her Dr. Alan Ripper recommendation for urologist (Dr. Elmore Guise).

## 2015-08-24 ENCOUNTER — Other Ambulatory Visit: Payer: Self-pay

## 2015-08-24 MED ORDER — AMLODIPINE BESYLATE 5 MG PO TABS: 5.0000 mg | ORAL_TABLET | Freq: Every day | ORAL | Status: DC

## 2015-08-24 MED ORDER — APIXABAN 5 MG PO TABS: 5.0000 mg | ORAL_TABLET | Freq: Two times a day (BID) | ORAL | Status: DC

## 2015-08-24 MED ORDER — ACEBUTOLOL HCL 200 MG PO CAPS: 200.0000 mg | ORAL_CAPSULE | Freq: Every day | ORAL | Status: DC

## 2015-08-27 ENCOUNTER — Other Ambulatory Visit: Payer: Self-pay

## 2015-08-27 MED ORDER — LISINOPRIL 10 MG PO TABS: 10.0000 mg | ORAL_TABLET | Freq: Two times a day (BID) | ORAL | Status: DC

## 2015-09-10 DIAGNOSIS — R2232 Localized swelling, mass and lump, left upper limb: Secondary | ICD-10-CM | POA: Diagnosis not present

## 2015-09-16 DIAGNOSIS — I701 Atherosclerosis of renal artery: Secondary | ICD-10-CM | POA: Diagnosis not present

## 2015-09-16 DIAGNOSIS — I4891 Unspecified atrial fibrillation: Secondary | ICD-10-CM | POA: Diagnosis not present

## 2015-09-16 DIAGNOSIS — I10 Essential (primary) hypertension: Secondary | ICD-10-CM | POA: Diagnosis not present

## 2015-09-16 DIAGNOSIS — Z01818 Encounter for other preprocedural examination: Secondary | ICD-10-CM | POA: Diagnosis not present

## 2015-09-16 DIAGNOSIS — E785 Hyperlipidemia, unspecified: Secondary | ICD-10-CM | POA: Diagnosis not present

## 2015-09-16 DIAGNOSIS — Z79899 Other long term (current) drug therapy: Secondary | ICD-10-CM | POA: Diagnosis not present

## 2015-09-16 DIAGNOSIS — R739 Hyperglycemia, unspecified: Secondary | ICD-10-CM | POA: Diagnosis not present

## 2015-09-16 DIAGNOSIS — R2232 Localized swelling, mass and lump, left upper limb: Secondary | ICD-10-CM | POA: Diagnosis not present

## 2015-09-28 DIAGNOSIS — N390 Urinary tract infection, site not specified: Secondary | ICD-10-CM | POA: Diagnosis not present

## 2015-10-07 DIAGNOSIS — Z1231 Encounter for screening mammogram for malignant neoplasm of breast: Secondary | ICD-10-CM | POA: Diagnosis not present

## 2015-10-11 DIAGNOSIS — N3020 Other chronic cystitis without hematuria: Secondary | ICD-10-CM | POA: Diagnosis not present

## 2015-10-11 DIAGNOSIS — Z0000 Encounter for general adult medical examination without abnormal findings: Secondary | ICD-10-CM | POA: Diagnosis not present

## 2015-10-18 DIAGNOSIS — R2232 Localized swelling, mass and lump, left upper limb: Secondary | ICD-10-CM | POA: Diagnosis not present

## 2015-10-18 DIAGNOSIS — N6019 Diffuse cystic mastopathy of unspecified breast: Secondary | ICD-10-CM | POA: Diagnosis not present

## 2015-11-03 DIAGNOSIS — M7541 Impingement syndrome of right shoulder: Secondary | ICD-10-CM | POA: Diagnosis not present

## 2015-11-03 DIAGNOSIS — M542 Cervicalgia: Secondary | ICD-10-CM | POA: Diagnosis not present

## 2015-11-05 ENCOUNTER — Other Ambulatory Visit: Payer: Self-pay | Admitting: Internal Medicine

## 2015-11-05 NOTE — Telephone Encounter (Signed)
acebutolol (SECTRAL) 200 MG capsule  Medication   Date: 08/24/2015  Department: [facility]  Ordering/Authorizing: Fay Records, MD      Order Providers    Prescribing Provider Encounter Provider   Fay Records, MD Carla Drape, CMA    Medication Detail      Disp Refills Start End     acebutolol (SECTRAL) 200 MG capsule 90 capsule 1 08/24/2015     Sig - Route: Take 1 capsule (200 mg total) by mouth daily. - Oral    E-Prescribing Status: Receipt confirmed by pharmacy (08/24/2015 10:39 AM EST)     Pharmacy    [facility], [place]   Will send refills to get them to [facility] 2017

## 2015-11-18 DIAGNOSIS — M25511 Pain in right shoulder: Secondary | ICD-10-CM | POA: Diagnosis not present

## 2015-11-18 DIAGNOSIS — M62521 Muscle wasting and atrophy, not elsewhere classified, right upper arm: Secondary | ICD-10-CM | POA: Diagnosis not present

## 2015-11-22 DIAGNOSIS — M62521 Muscle wasting and atrophy, not elsewhere classified, right upper arm: Secondary | ICD-10-CM | POA: Diagnosis not present

## 2015-11-22 DIAGNOSIS — M25511 Pain in right shoulder: Secondary | ICD-10-CM | POA: Diagnosis not present

## 2015-11-24 DIAGNOSIS — M62521 Muscle wasting and atrophy, not elsewhere classified, right upper arm: Secondary | ICD-10-CM | POA: Diagnosis not present

## 2015-11-24 DIAGNOSIS — M25511 Pain in right shoulder: Secondary | ICD-10-CM | POA: Diagnosis not present

## 2015-11-26 DIAGNOSIS — M62521 Muscle wasting and atrophy, not elsewhere classified, right upper arm: Secondary | ICD-10-CM | POA: Diagnosis not present

## 2015-11-26 DIAGNOSIS — M25511 Pain in right shoulder: Secondary | ICD-10-CM | POA: Diagnosis not present

## 2015-11-29 DIAGNOSIS — M62521 Muscle wasting and atrophy, not elsewhere classified, right upper arm: Secondary | ICD-10-CM | POA: Diagnosis not present

## 2015-11-29 DIAGNOSIS — M25511 Pain in right shoulder: Secondary | ICD-10-CM | POA: Diagnosis not present

## 2015-12-01 DIAGNOSIS — L821 Other seborrheic keratosis: Secondary | ICD-10-CM | POA: Diagnosis not present

## 2015-12-01 DIAGNOSIS — M25511 Pain in right shoulder: Secondary | ICD-10-CM | POA: Diagnosis not present

## 2015-12-01 DIAGNOSIS — R040 Epistaxis: Secondary | ICD-10-CM | POA: Diagnosis not present

## 2015-12-01 DIAGNOSIS — I10 Essential (primary) hypertension: Secondary | ICD-10-CM | POA: Diagnosis not present

## 2015-12-01 DIAGNOSIS — L578 Other skin changes due to chronic exposure to nonionizing radiation: Secondary | ICD-10-CM | POA: Diagnosis not present

## 2015-12-01 DIAGNOSIS — L570 Actinic keratosis: Secondary | ICD-10-CM | POA: Diagnosis not present

## 2015-12-01 DIAGNOSIS — M62521 Muscle wasting and atrophy, not elsewhere classified, right upper arm: Secondary | ICD-10-CM | POA: Diagnosis not present

## 2015-12-01 DIAGNOSIS — C44629 Squamous cell carcinoma of skin of left upper limb, including shoulder: Secondary | ICD-10-CM | POA: Diagnosis not present

## 2015-12-01 DIAGNOSIS — I4891 Unspecified atrial fibrillation: Secondary | ICD-10-CM | POA: Diagnosis not present

## 2015-12-03 DIAGNOSIS — M25511 Pain in right shoulder: Secondary | ICD-10-CM | POA: Diagnosis not present

## 2015-12-03 DIAGNOSIS — M62521 Muscle wasting and atrophy, not elsewhere classified, right upper arm: Secondary | ICD-10-CM | POA: Diagnosis not present

## 2015-12-07 ENCOUNTER — Telehealth: Payer: Self-pay | Admitting: Internal Medicine

## 2015-12-07 NOTE — Telephone Encounter (Signed)
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

## 2015-12-07 NOTE — Telephone Encounter (Signed)
Pt states she is calling to make Dr Harrington Challenger aware that she  is not going to take ibuprofen since she is having hand surgery this Friday. Pt advised she should not substitute ibuprofen for Eliquis. Pt states she asked her dermatologist if she needed to hold Eliquis prior to hand surgery and the dermatologist told her she did not need to hold Eliquis.

## 2015-12-07 NOTE — Telephone Encounter (Signed)
Pt states has been having mild shoulder pain . She was seen by his PCP  Which recommends for pt  to take Ibuprofen 400 to 600 mg once daily. Pt is taking Eliquis 5 mg twice daily. Pt would like to know if Dr. Harrington Challenger okay for pt to substitute one dose of Eliquis with  the Ibuprofen once day, just for a few days.

## 2015-12-07 NOTE — Telephone Encounter (Signed)
New message   pt wants to take Elliquis mg 1x day and take ibuprofen together for a few days for the mild shoulder ache

## 2015-12-09 DIAGNOSIS — M25511 Pain in right shoulder: Secondary | ICD-10-CM | POA: Diagnosis not present

## 2015-12-09 DIAGNOSIS — M62521 Muscle wasting and atrophy, not elsewhere classified, right upper arm: Secondary | ICD-10-CM | POA: Diagnosis not present

## 2015-12-13 DIAGNOSIS — M25511 Pain in right shoulder: Secondary | ICD-10-CM | POA: Diagnosis not present

## 2015-12-13 DIAGNOSIS — M62521 Muscle wasting and atrophy, not elsewhere classified, right upper arm: Secondary | ICD-10-CM | POA: Diagnosis not present

## 2015-12-20 DIAGNOSIS — M25511 Pain in right shoulder: Secondary | ICD-10-CM | POA: Diagnosis not present

## 2015-12-20 DIAGNOSIS — M62521 Muscle wasting and atrophy, not elsewhere classified, right upper arm: Secondary | ICD-10-CM | POA: Diagnosis not present

## 2015-12-27 ENCOUNTER — Ambulatory Visit: Payer: Medicare HMO | Admitting: Internal Medicine

## 2015-12-28 ENCOUNTER — Other Ambulatory Visit: Payer: Self-pay | Admitting: Internal Medicine

## 2015-12-28 ENCOUNTER — Encounter: Payer: Self-pay | Admitting: *Deleted

## 2015-12-29 DIAGNOSIS — M7541 Impingement syndrome of right shoulder: Secondary | ICD-10-CM | POA: Diagnosis not present

## 2015-12-29 DIAGNOSIS — M542 Cervicalgia: Secondary | ICD-10-CM | POA: Diagnosis not present

## 2015-12-31 ENCOUNTER — Ambulatory Visit (INDEPENDENT_AMBULATORY_CARE_PROVIDER_SITE_OTHER): Payer: PPO | Admitting: Internal Medicine

## 2015-12-31 ENCOUNTER — Encounter: Payer: Self-pay | Admitting: Internal Medicine

## 2015-12-31 VITALS — BP 133/73 | HR 66 | Ht 64.5 in | Wt 125.8 lb

## 2015-12-31 DIAGNOSIS — I10 Essential (primary) hypertension: Secondary | ICD-10-CM

## 2015-12-31 DIAGNOSIS — I493 Ventricular premature depolarization: Secondary | ICD-10-CM

## 2015-12-31 LAB — LIPID PANEL
CHOL/HDL RATIO: 2.6 ratio (ref <=5.0)
CHOLESTEROL: 140 mg/dL (ref 125–200)
HDL: 54 mg/dL (ref >=46)
LDL Cholesterol: 65 mg/dL (ref <130)
Triglycerides: 104 mg/dL (ref <150)
VLDL: 21 mg/dL (ref <30)

## 2015-12-31 LAB — BASIC METABOLIC PANEL
BUN: 24 mg/dL (ref 7–25)
CALCIUM: 9.6 mg/dL (ref 8.6–10.4)
CO2: 30 mmol/L (ref 20–31)
CREATININE: 0.73 mg/dL (ref 0.60–0.93)
Chloride: 101 mmol/L (ref 98–110)
Glucose, Bld: 98 mg/dL (ref 65–99)
POTASSIUM: 4.2 mmol/L (ref 3.5–5.3)
Sodium: 138 mmol/L (ref 135–146)

## 2015-12-31 LAB — CBC
HCT: 38.2 % (ref 35.0–45.0)
HEMOGLOBIN: 12.6 g/dL (ref 11.7–15.5)
MCH: 28.3 pg (ref 27.0–33.0)
MCHC: 33 g/dL (ref 32.0–36.0)
MCV: 85.7 fL (ref 80.0–100.0)
MPV: 10.7 fL (ref 7.5–12.5)
PLATELETS: 251 10*3/uL (ref 140–400)
RBC: 4.46 MIL/uL (ref 3.80–5.10)
RDW: 14.2 % (ref 11.0–15.0)
WBC: 4.7 10*3/uL (ref 3.8–10.8)

## 2015-12-31 NOTE — Progress Notes (Signed)
Cardiology Office Note   Date:  12/31/2015   ID:  Stefanie Ferguson, DOB [birth year], MRN [id number]  PCP:  Gilford Rile, MD  Cardiologist:   Dorris Carnes, MD   F/U of HTN and PAF    History of Present Illness: Stefanie Ferguson is a 81 y.o. female with a history of HTN, PAF,  RAS, CV dz  Normal coronaries in 2007  Echo normal  I saw her in August  Since seen she has done OK   Breathing is OK  No CP  No dizziness NO palpitations   She remains active      Outpatient Prescriptions Prior to Visit  Medication Sig Dispense Refill  . acebutolol (SECTRAL) 200 MG capsule TAKE 1 CAPSULE BY MOUTH ONCE (1) DAILY 90 capsule 1  . acetaminophen (TYLENOL) 325 MG tablet Take 650 mg by mouth every 6 (six) hours as needed for mild pain.     Marland Kitchen amLODipine (NORVASC) 5 MG tablet TAKE 1 TABLET BY MOUTH ONCE (1) DAILY 90 tablet 0  . apixaban (ELIQUIS) 5 MG TABS tablet Take 1 tablet (5 mg total) by mouth 2 (two) times daily. 180 tablet 2  . atorvastatin (LIPITOR) 10 MG tablet Take 0.5 tablets (5 mg total) by mouth 3 (three) times a week. 15 tablet 3  . Calcium Citrate-Vitamin D (CALCIUM + D PO) Take 1 tablet by mouth daily.    . hydrochlorothiazide (HYDRODIURIL) 25 MG tablet Take 0.5 tablets (12.5 mg total) by mouth daily. 45 tablet 3  . lisinopril (PRINIVIL,ZESTRIL) 10 MG tablet Take 1 tablet (10 mg total) by mouth 2 (two) times daily. 180 tablet 2  . Omega 3 1000 MG CAPS Take 1 capsule by mouth.    . tetrahydrozoline 0.05 % ophthalmic solution Place 2 drops into both eyes 2 (two) times daily.     No facility-administered medications prior to visit.     Allergies:   Review of patient's allergies indicates no known allergies.   Past Medical History  Diagnosis Date  . Chest pain     normal coronary agniography in 2006 or 2007  . Symptomatic PVCs     with palpitations  . Heart palpitations     caused by PVC's  . Heart murmur   . Premature ventricular contractions   . Hypertension   . Renal artery  stenosis ([place])   . Hx of echocardiogram 2015    Echo (12/2013):  Mild LVH, EF 55%, no RWMA, Gr 1 DD, mild MR, mild LAE, normal RVF, PASP 23 mmHg    Past Surgical History  Procedure Laterality Date  . Cardiac catheterization    . Rotator cuff repair  1996/2000    bilateral  . Cystocele repair  2009    and Rectocele  . Vaginal hysterectomy  2009  . Cholecystectomy open  1988  . Tubal ligation  1988  . Breast surgery  2011    excision papilloma left breast     Social History:  The patient  reports that she has never smoked. She does not have any smokeless tobacco history on file. She reports that she does not drink alcohol or use illicit drugs.   Family History:  The patient's family history includes Aneurysm (age of onset: 25) in her father; Esophageal cancer in her brother; Hypertension in her mother; Pulmonary embolism in her brother; Stomach cancer in her brother; Vascular Disease in her brother.    ROS:  Please see the history of present illness. All other systems are  reviewed and  Negative to the above problem except as noted.    PHYSICAL EXAM: VS:  BP 133/73 mmHg  Pulse 66  Ht 5' 4.5" (1.638 m)  Wt 125 lb 12.8 oz (57.063 kg)  BMI 21.27 kg/m2  GEN: Well nourished, well developed, in no acute distress HEENT: normal Neck: no JVD, carotid bruits, or masses Cardiac: RRR; no murmurs, rubs, or gallops,no edema  Respiratory:  clear to auscultation bilaterally, normal work of breathing GI: soft, nontender, nondistended, + BS  No hepatomegaly  MS: no deformity Moving all extremities   Skin: warm and dry, no rash Neuro:  Strength and sensation are intact Psych: euthymic mood, full affect   EKG:  EKG is ordered today.   SR 62 bpm  Occasional PACs  Nonspecific ST T wave changes     Lipid Panel    Component Value Date/Time   CHOL 159 07/28/2013 1132   TRIG 92.0 07/28/2013 1132   HDL 57.30 07/28/2013 1132   CHOLHDL 3 07/28/2013 1132   VLDL 18.4 07/28/2013 1132   LDLCALC  83 07/28/2013 1132      Wt Readings from Last 3 Encounters:  12/31/15 125 lb 12.8 oz (57.063 kg)  04/01/15 126 lb 1.9 oz (57.208 kg)  08/03/14 126 lb (57.153 kg)      ASSESSMENT AND PLAN:  1  Atrial fib PAF  Found on monitor  Not symptomatic  Keep on current regimen  Check CBC and BMET  2.  CV dz  Follow lipids    3  HTN   BP is OK    Taking lisinoprl 1 time per day   Follows BP    4  HL  Check labs     I will see her in 9 months     Signed, Dorris Carnes, MD  12/31/2015 9:36 AM    [facility] [place], [place], [place]  [address] Phone: [phone]; Fax: [phone]

## 2015-12-31 NOTE — Patient Instructions (Signed)
Your physician recommends that you continue on your current medications as directed. Please refer to the Current Medication list given to you today. Your physician recommends that you return for lab work in: today (CBC, [place], BMET)  Your physician wants you to follow-up in: [place].  You will receive a reminder letter in the mail two months in advance. If you don't receive a letter, please call our office to schedule the follow-up appointment.

## 2016-01-03 DIAGNOSIS — C44629 Squamous cell carcinoma of skin of left upper limb, including shoulder: Secondary | ICD-10-CM | POA: Diagnosis not present

## 2016-01-20 DIAGNOSIS — C44629 Squamous cell carcinoma of skin of left upper limb, including shoulder: Secondary | ICD-10-CM | POA: Diagnosis not present

## 2016-01-20 DIAGNOSIS — L821 Other seborrheic keratosis: Secondary | ICD-10-CM | POA: Diagnosis not present

## 2016-01-20 DIAGNOSIS — L570 Actinic keratosis: Secondary | ICD-10-CM | POA: Diagnosis not present

## 2016-01-20 DIAGNOSIS — L820 Inflamed seborrheic keratosis: Secondary | ICD-10-CM | POA: Diagnosis not present

## 2016-01-20 DIAGNOSIS — L578 Other skin changes due to chronic exposure to nonionizing radiation: Secondary | ICD-10-CM | POA: Diagnosis not present

## 2016-03-17 DIAGNOSIS — N390 Urinary tract infection, site not specified: Secondary | ICD-10-CM | POA: Diagnosis not present

## 2016-03-17 DIAGNOSIS — I10 Essential (primary) hypertension: Secondary | ICD-10-CM | POA: Diagnosis not present

## 2016-04-17 ENCOUNTER — Other Ambulatory Visit: Payer: Self-pay | Admitting: Internal Medicine

## 2016-04-20 ENCOUNTER — Telehealth: Payer: Self-pay | Admitting: Internal Medicine

## 2016-04-20 NOTE — Telephone Encounter (Signed)
Returned call to patient.She stated she wanted to ask Dr.Ross when she takes Acebutolol 200 mg how soon can she take a second one if she needs to.Message sent to Dr.Ross for advice.

## 2016-04-20 NOTE — Telephone Encounter (Signed)
Returned call to patient.No answer.LMTC. 

## 2016-04-20 NOTE — Telephone Encounter (Signed)
Can take for heart racing   Probably OK as soon as a couple hours after Let office know if she has to do this.

## 2016-04-20 NOTE — Telephone Encounter (Signed)
New Message  Pt c/o medication issue:  1. Name of Medication: Acebutolol  2. How are you currently taking this medication (dosage and times per day)? 200mg    3. Are you having a reaction (difficulty breathing--STAT)? Per pt no   4. What is your medication issue? Pt call requesting to speak with RN. Pt states she was instructed to take 2 pills in the pass. Pt wants to know is she is able to take two pills now. Please call back to discuss

## 2016-04-21 DIAGNOSIS — L821 Other seborrheic keratosis: Secondary | ICD-10-CM | POA: Diagnosis not present

## 2016-04-21 DIAGNOSIS — L570 Actinic keratosis: Secondary | ICD-10-CM | POA: Diagnosis not present

## 2016-04-21 DIAGNOSIS — C44629 Squamous cell carcinoma of skin of left upper limb, including shoulder: Secondary | ICD-10-CM | POA: Diagnosis not present

## 2016-04-24 NOTE — Telephone Encounter (Signed)
No new recommendations

## 2016-04-24 NOTE — Telephone Encounter (Signed)
Advised pt that she could take take a second dose of acebutolol 2-3 hours after taking her regular daily dose, but only if her heart is racing.     She verbalizes understanding of this.  She states that her heart was not racing, rate is usually 70-80.  Feels palpitations, denies heart pounding.  Once a couple days ago thought she was going to "go out" like she was waiting for her heart to beat again.  She thought this lasted for about 2 seconds.  It only occurred that one time.   No episodes of syncope reported.     She is taking a new supplement that is minerals and vitamins.  The ingredient list does not seem to have any stimulants listed.   It is Super DK Calcium Plus.  She called the Eliquis drug company and they have told her it was safe to take.      Forwarding to Dr. Harrington Challenger for any new recommendations.

## 2016-05-20 ENCOUNTER — Other Ambulatory Visit: Payer: Self-pay | Admitting: Internal Medicine

## 2016-05-29 DIAGNOSIS — I4891 Unspecified atrial fibrillation: Secondary | ICD-10-CM | POA: Diagnosis not present

## 2016-05-29 DIAGNOSIS — I10 Essential (primary) hypertension: Secondary | ICD-10-CM | POA: Diagnosis not present

## 2016-07-05 ENCOUNTER — Other Ambulatory Visit: Payer: Self-pay | Admitting: Internal Medicine

## 2016-07-05 ENCOUNTER — Other Ambulatory Visit: Payer: Self-pay | Admitting: *Deleted

## 2016-07-05 MED ORDER — AMLODIPINE BESYLATE 5 MG PO TABS: ORAL_TABLET | ORAL | 1 refills | Status: DC

## 2016-07-11 ENCOUNTER — Other Ambulatory Visit: Payer: Self-pay | Admitting: Internal Medicine

## 2016-07-11 MED ORDER — ATORVASTATIN CALCIUM 10 MG PO TABS: 5.0000 mg | ORAL_TABLET | ORAL | 1 refills | Status: DC

## 2016-08-24 DIAGNOSIS — J029 Acute pharyngitis, unspecified: Secondary | ICD-10-CM | POA: Diagnosis not present

## 2016-08-24 DIAGNOSIS — J069 Acute upper respiratory infection, unspecified: Secondary | ICD-10-CM | POA: Diagnosis not present

## 2016-10-09 DIAGNOSIS — Z1231 Encounter for screening mammogram for malignant neoplasm of breast: Secondary | ICD-10-CM | POA: Diagnosis not present

## 2016-10-11 ENCOUNTER — Other Ambulatory Visit: Payer: Self-pay | Admitting: Internal Medicine

## 2016-10-23 ENCOUNTER — Telehealth: Payer: Self-pay | Admitting: Internal Medicine

## 2016-10-23 DIAGNOSIS — N6019 Diffuse cystic mastopathy of unspecified breast: Secondary | ICD-10-CM | POA: Diagnosis not present

## 2016-10-23 NOTE — Telephone Encounter (Signed)
New message  Pt call requesting to speak with RN or Dr. Harrington Challenger. Pt would like to get a good recommendation for an ENT doctor. Please call back to discuss

## 2016-10-26 ENCOUNTER — Other Ambulatory Visit: Payer: Self-pay | Admitting: *Deleted

## 2016-10-26 MED ORDER — APIXABAN 5 MG PO TABS: ORAL_TABLET | ORAL | 1 refills | Status: DC

## 2016-10-26 NOTE — Telephone Encounter (Signed)
Request for Eliquis 5mg  received; pt age 51yrs old, wt-57.1kg, Crea-0.73 on 12/31/15, last seen by Dr. Harrington Challenger in May 2017. She has an appt scheduled for Dr. Harrington Challenger in April 2018, placed a note on appt to obtain CBC/BMET for Eliquis f/u labs. Pt will be 82years old in May, therefore if weight remains less than 60 kg she will need a dose change.

## 2016-10-26 NOTE — Telephone Encounter (Signed)
Patient asked for ENT recommendation because her nose is bleeding, from one same area all the time she thinks.   I advised of easier bleeding or more difficulty stopping bleeding due to taking Eliquis.  She has now decided to go to her PCP first and if they suggest she will see ENT.  Advised on saline nasal spray for dryness.  Pt uses Vaseline.  She is able to get bleeding stopped each time.   Has appointment 11/03/16 with Dr. Harrington Challenger.

## 2016-10-26 NOTE — Telephone Encounter (Signed)
Appointment is 4/918.  Confirmed with patient, so she is aware.

## 2016-10-27 ENCOUNTER — Other Ambulatory Visit: Payer: Self-pay | Admitting: Internal Medicine

## 2016-10-30 DIAGNOSIS — R040 Epistaxis: Secondary | ICD-10-CM | POA: Diagnosis not present

## 2016-10-30 DIAGNOSIS — I10 Essential (primary) hypertension: Secondary | ICD-10-CM | POA: Diagnosis not present

## 2016-11-02 DIAGNOSIS — I4891 Unspecified atrial fibrillation: Secondary | ICD-10-CM | POA: Diagnosis not present

## 2016-11-02 DIAGNOSIS — M940 Chondrocostal junction syndrome [Tietze]: Secondary | ICD-10-CM | POA: Diagnosis not present

## 2016-11-02 DIAGNOSIS — I10 Essential (primary) hypertension: Secondary | ICD-10-CM | POA: Diagnosis not present

## 2016-11-02 DIAGNOSIS — Z7901 Long term (current) use of anticoagulants: Secondary | ICD-10-CM | POA: Diagnosis not present

## 2016-11-10 DIAGNOSIS — R0789 Other chest pain: Secondary | ICD-10-CM | POA: Diagnosis not present

## 2016-11-10 DIAGNOSIS — R079 Chest pain, unspecified: Secondary | ICD-10-CM | POA: Diagnosis not present

## 2016-11-22 ENCOUNTER — Encounter: Payer: Self-pay | Admitting: Internal Medicine

## 2016-12-03 NOTE — Progress Notes (Deleted)
Cardiology Office Note   Date:  12/03/2016   ID:  Stefanie Ferguson, DOB [birth year], MRN [id number]  PCP:  Gilford Rile, MD  Cardiologist:   Dorris Carnes, MD   F/U of HTN and PAF    History of Present Illness: Stefanie Ferguson is a 82 y.o. female with a history of HTN, PAF,  RAS, CV dz  Normal coronaries in 2007  Echo normal  I saw her in may 2017  Outpatient Medications Prior to Visit  Medication Sig Dispense Refill  . acebutolol (SECTRAL) 200 MG capsule TAKE 1 CAPSULE BY MOUTH ONCE (1) DAILY 90 capsule 2  . acetaminophen (TYLENOL) 325 MG tablet Take 650 mg by mouth every 6 (six) hours as needed for mild pain.     Marland Kitchen amLODipine (NORVASC) 5 MG tablet TAKE 1 TABLET BY MOUTH ONCE (1) DAILY 90 tablet 0  . apixaban (ELIQUIS) 5 MG TABS tablet TAKE 1 TABLET BY MOUTH TWICE (2) DAILY 180 tablet 1  . atorvastatin (LIPITOR) 10 MG tablet Take 0.5 tablets (5 mg total) by mouth 3 (three) times a week. 15 tablet 1  . Calcium Citrate-Vitamin D (CALCIUM + D PO) Take 1 tablet by mouth daily.    . hydrochlorothiazide (HYDRODIURIL) 25 MG tablet TAKE 1/2 TABLET BY MOUTH ONCE DAILY 45 tablet 0  . lisinopril (PRINIVIL,ZESTRIL) 10 MG tablet Take 1 tablet (10 mg total) by mouth 2 (two) times daily. 180 tablet 2  . Omega 3 1000 MG CAPS Take 1 capsule by mouth.    . tetrahydrozoline 0.05 % ophthalmic solution Place 2 drops into both eyes 2 (two) times daily.     No facility-administered medications prior to visit.      Allergies:   Patient has no known allergies.   Past Medical History:  Diagnosis Date  . Chest pain    normal coronary agniography in 2006 or 2007  . Heart murmur   . Heart palpitations    caused by PVC's  . Hx of echocardiogram 2015   Echo (12/2013):  Mild LVH, EF 55%, no RWMA, Gr 1 DD, mild MR, mild LAE, normal RVF, PASP 23 mmHg  . Hypertension   . Premature ventricular contractions   . Renal artery stenosis ([place])   . Symptomatic PVCs    with palpitations    Past Surgical History:    Procedure Laterality Date  . BREAST SURGERY  2011   excision papilloma left breast  . CARDIAC CATHETERIZATION    . CHOLECYSTECTOMY OPEN  1988  . CYSTOCELE REPAIR  2009   and Rectocele  . ROTATOR CUFF REPAIR  1996/2000   bilateral  . TUBAL LIGATION  1988  . VAGINAL HYSTERECTOMY  2009     Social History:  The patient  reports that she has never smoked. She has never used smokeless tobacco. She reports that she does not drink alcohol or use drugs.   Family History:  The patient's family history includes Aneurysm (age of onset: 36) in her father; Esophageal cancer in her brother; Hypertension in her mother; Pulmonary embolism in her brother; Stomach cancer in her brother; Vascular Disease in her brother.    ROS:  Please see the history of present illness. All other systems are reviewed and  Negative to the above problem except as noted.    PHYSICAL EXAM: VS:  There were no vitals taken for this visit.  GEN: Well nourished, well developed, in no acute distress HEENT: normal Neck: no JVD, carotid bruits, or masses Cardiac: RRR;  no murmurs, rubs, or gallops,no edema  Respiratory:  clear to auscultation bilaterally, normal work of breathing GI: soft, nontender, nondistended, + BS  No hepatomegaly  MS: no deformity Moving all extremities   Skin: warm and dry, no rash Neuro:  Strength and sensation are intact Psych: euthymic mood, full affect   EKG:  EKG is ordered today.   SR 62 bpm  Occasional PACs  Nonspecific ST T wave changes     Lipid Panel    Component Value Date/Time   CHOL 140 12/31/2015 1018   TRIG 104 12/31/2015 1018   HDL 54 12/31/2015 1018   CHOLHDL 2.6 12/31/2015 1018   VLDL 21 12/31/2015 1018   LDLCALC 65 12/31/2015 1018      Wt Readings from Last 3 Encounters:  12/31/15 125 lb 12.8 oz (57.1 kg)  04/01/15 126 lb 1.9 oz (57.2 kg)  08/03/14 126 lb (57.2 kg)      ASSESSMENT AND PLAN:  1  Atrial fib PAF  Found on monitor  Not symptomatic  Keep on current  regimen  Check CBC and BMET  2.  CV dz  Follow lipids    3  HTN   BP is OK    Taking lisinoprl 1 time per day   Follows BP    4  HL  Check labs     I will see her in 9 months     Signed, Dorris Carnes, MD  12/03/2016 10:28 PM    [facility] [place], [place], [place]  [address] Phone: [phone]; Fax: [phone]

## 2016-12-04 ENCOUNTER — Ambulatory Visit: Payer: PPO | Admitting: Internal Medicine

## 2016-12-05 DIAGNOSIS — N3000 Acute cystitis without hematuria: Secondary | ICD-10-CM | POA: Diagnosis not present

## 2016-12-05 DIAGNOSIS — N3090 Cystitis, unspecified without hematuria: Secondary | ICD-10-CM | POA: Diagnosis not present

## 2017-01-08 ENCOUNTER — Other Ambulatory Visit: Payer: Self-pay | Admitting: Internal Medicine

## 2017-01-09 NOTE — Progress Notes (Signed)
Cardiology Office Note   Date:  01/10/2017   ID:  HELENA SARDO, DOB 28-Mar-1935, MRN [id number]  PCP:  Raina Mina., MD  Cardiologist:   Dorris Carnes, MD   F/U of CAD and PAF      History of Present Illness: Stefanie Ferguson is a 82 y.o. female with a history of  HTN, PAF, RAS, CV dz  Normal coronary arteries in 2007  I saw her in May 2017    REcently not feeling good in morning Fluttery in chest   Little more SOB  Tired   Occurs earlier in AM  From about 6 to 10  Takes meds  Around 10  Then feels OK   Has never taken BP when feels bad   Otherwise during day feels OK  Active  Works in yard       Current Meds  Medication Sig  . acebutolol (SECTRAL) 200 MG capsule Take 1 capsule (200 mg total) by mouth daily.  Marland Kitchen acetaminophen (TYLENOL) 325 MG tablet Take 650 mg by mouth every 6 (six) hours as needed for mild pain.   Marland Kitchen amLODipine (NORVASC) 5 MG tablet TAKE 1 TABLET BY MOUTH ONCE (1) DAILY  . apixaban (ELIQUIS) 5 MG TABS tablet TAKE 1 TABLET BY MOUTH TWICE (2) DAILY  . hydrochlorothiazide (HYDRODIURIL) 25 MG tablet TAKE 1/2 TABLET BY MOUTH ONCE DAILY  . Omega 3 1000 MG CAPS Take 1 capsule by mouth.  . tetrahydrozoline 0.05 % ophthalmic solution Place 2 drops into both eyes 2 (two) times daily.     Allergies:   Patient has no known allergies.   Past Medical History:  Diagnosis Date  . Chest pain    normal coronary agniography in 2006 or 2007  . Heart murmur   . Heart palpitations    caused by PVC's  . Hx of echocardiogram 2015   Echo (12/2013):  Mild LVH, EF 55%, no RWMA, Gr 1 DD, mild MR, mild LAE, normal RVF, PASP 23 mmHg  . Hypertension   . Premature ventricular contractions   . Renal artery stenosis ([place])   . Symptomatic PVCs    with palpitations    Past Surgical History:  Procedure Laterality Date  . BREAST SURGERY  2011   excision papilloma left breast  . CARDIAC CATHETERIZATION    . CHOLECYSTECTOMY OPEN  1988  . CYSTOCELE REPAIR  2009   and Rectocele    . ROTATOR CUFF REPAIR  1996/2000   bilateral  . TUBAL LIGATION  1988  . VAGINAL HYSTERECTOMY  2009     Social History:  The patient  reports that she has never smoked. She has never used smokeless tobacco. She reports that she does not drink alcohol or use drugs.   Family History:  The patient's family history includes Aneurysm (age of onset: 7) in her father; Esophageal cancer in her brother; Hypertension in her mother; Pulmonary embolism in her brother; Stomach cancer in her brother; Vascular Disease in her brother.    ROS:  Please see the history of present illness. All other systems are reviewed and  Negative to the above problem except as noted.    PHYSICAL EXAM: VS:  BP 130/70   Pulse 72   Ht 5\' 4"  (1.626 m)   Wt 124 lb 1.9 oz (56.3 kg)   BMI 21.31 kg/m   GEN: Well nourished, well developed, in no acute distress  HEENT: normal  Neck: no JVD, carotid bruits, or masses Cardiac: RRR; no  murmurs, rubs, or gallops,no edema  Respiratory:  clear to auscultation bilaterally, normal work of breathing GI: soft, nontender, nondistended, + BS  No hepatomegaly  MS: no deformity Moving all extremities   Skin: warm and dry, no rash Neuro:  Strength and sensation are intact Psych: euthymic mood, full affect   EKG:  EKG is ordered today.  SR with occas PAC  72 bpm  LAA   Lipid Panel    Component Value Date/Time   CHOL 140 12/31/2015 1018   TRIG 104 12/31/2015 1018   HDL 54 12/31/2015 1018   CHOLHDL 2.6 12/31/2015 1018   VLDL 21 12/31/2015 1018   LDLCALC 65 12/31/2015 1018      Wt Readings from Last 3 Encounters:  01/10/17 124 lb 1.9 oz (56.3 kg)  12/31/15 125 lb 12.8 oz (57.1 kg)  04/01/15 126 lb 1.9 oz (57.2 kg)      ASSESSMENT AND PLAN:  1 Fatigue  I am not sure what this represents  Before meds  OK later in day I encouraged her to take BP and HR when she feels this way  Call if high/low  2  PAF  Continue meds  Check CBC  Check BMET  Check TSH  3    HL   Stopped statin  Check labs     4  HTN  BP good  Off of lisinopril    F/U in the fall, sooner if feeling bad    Current medicines are reviewed at length with the patient today.  The patient does not have concerns regarding medicines.  Signed, Dorris Carnes, MD  01/10/2017 9:08 PM    [facility] [place], [place], [place]  [address] Phone: [phone]; Fax: [phone]

## 2017-01-10 ENCOUNTER — Ambulatory Visit (INDEPENDENT_AMBULATORY_CARE_PROVIDER_SITE_OTHER): Payer: PPO | Admitting: Internal Medicine

## 2017-01-10 ENCOUNTER — Encounter (INDEPENDENT_AMBULATORY_CARE_PROVIDER_SITE_OTHER): Payer: Self-pay

## 2017-01-10 ENCOUNTER — Encounter: Payer: Self-pay | Admitting: Internal Medicine

## 2017-01-10 VITALS — BP 130/70 | HR 72 | Ht 64.0 in | Wt 124.1 lb

## 2017-01-10 DIAGNOSIS — I10 Essential (primary) hypertension: Secondary | ICD-10-CM | POA: Diagnosis not present

## 2017-01-10 DIAGNOSIS — R5383 Other fatigue: Secondary | ICD-10-CM | POA: Diagnosis not present

## 2017-01-10 DIAGNOSIS — I480 Paroxysmal atrial fibrillation: Secondary | ICD-10-CM | POA: Diagnosis not present

## 2017-01-10 NOTE — Patient Instructions (Addendum)
Your physician recommends that you continue on your current medications as directed. Please refer to the Current Medication list given to you today.  Your physician recommends that you return for lab work in: TSH, BMET, CBC,  LIPIDS  Your physician wants you to follow-up in: [place].  You will receive a reminder letter in the mail two months in advance. If you don't receive a letter, please call our office to schedule the follow-up appointment.

## 2017-01-11 LAB — CBC
Hematocrit: 37.7 % (ref 34.0–46.6)
Hemoglobin: 12.5 g/dL (ref 11.1–15.9)
MCH: 28.9 pg (ref 26.6–33.0)
MCHC: 33.2 g/dL (ref 31.5–35.7)
MCV: 87 fL (ref 79–97)
PLATELETS: 233 10*3/uL (ref 150–379)
RBC: 4.32 x10E6/uL (ref 3.77–5.28)
RDW: 14.2 % (ref 12.3–15.4)
WBC: 5.3 10*3/uL (ref 3.4–10.8)

## 2017-01-11 LAB — BASIC METABOLIC PANEL
BUN/Creatinine Ratio: 25 (ref 12–28)
BUN: 18 mg/dL (ref 8–27)
CALCIUM: 9.4 mg/dL (ref 8.7–10.3)
CO2: 26 mmol/L (ref 18–29)
CREATININE: 0.72 mg/dL (ref 0.57–1.00)
Chloride: 96 mmol/L (ref 96–106)
GFR, EST AFRICAN AMERICAN: 92 mL/min/{1.73_m2} (ref >59)
GFR, EST NON AFRICAN AMERICAN: 80 mL/min/{1.73_m2} (ref >59)
Glucose: 135 mg/dL — ABNORMAL HIGH (ref 65–99)
POTASSIUM: 3.4 mmol/L — AB (ref 3.5–5.2)
Sodium: 138 mmol/L (ref 134–144)

## 2017-01-11 LAB — TSH: TSH: 2.24 u[IU]/mL (ref 0.450–4.500)

## 2017-01-11 LAB — LIPID PANEL
CHOL/HDL RATIO: 3.1 ratio (ref 0.0–4.4)
Cholesterol, Total: 165 mg/dL (ref 100–199)
HDL: 54 mg/dL (ref >39)
LDL CALC: 87 mg/dL (ref 0–99)
Triglycerides: 121 mg/dL (ref 0–149)
VLDL Cholesterol Cal: 24 mg/dL (ref 5–40)

## 2017-01-19 ENCOUNTER — Ambulatory Visit: Payer: PPO | Admitting: Internal Medicine

## 2017-01-19 ENCOUNTER — Telehealth: Payer: Self-pay | Admitting: *Deleted

## 2017-01-19 MED ORDER — APIXABAN 2.5 MG PO TABS: 2.5000 mg | ORAL_TABLET | Freq: Two times a day (BID) | ORAL | 3 refills | Status: DC

## 2017-01-19 NOTE — Telephone Encounter (Signed)
Pt had labs drawn on 01/10/17 after appt with Dr. Harrington Challenger & once labs returned it was determined that the pt should be taking Eliquis 2.5mg  twice a day & not 5mg  twice a day due to meeting 2 out of 3 of the dosing criteria. Spoke with Dr. Harrington Challenger & with her approval pt will be switched to 2.5mg  twice a day.   Called the pt to inform her that her dose of Eliquis needs to be reduced to 2.5mg  twice a day due to meeting doseage criteria for Eliquis. Pt turned 82 years old today, Wt-56.3kg, and Crea-0.72 (meets 2 out of 3 of the dosing criteria). Pt was last seen by Dr. Harrington Challenger on 01/10/17 & labs were drawn at that time. Explained and educated the pt on the reason for the dose reduction & she verbalized understanding & stated she would start 2.5mg  tonight since she has already taken the morning dose. Pt stated that she has just picked up Eliquis 5mg ; advised the pt that it is safe to split the pill in half & take 2.5mg  twice a day & she verbalized understanding.  Also, advised that I would send in the 2.5mg  tablet for her next refill & she verbalized understanding.

## 2017-01-25 ENCOUNTER — Telehealth: Payer: Self-pay | Admitting: Internal Medicine

## 2017-01-25 NOTE — Telephone Encounter (Signed)
New message     Arbie Cookey from [facility] is calling for clarification on prescription. She said she is calling to verify dose decrease. Previous dose of Eliquis was 5 mg and the new prescription is for 2.5mg . She said if a message has to be left because she is on the other line, she needs a full name for it to be valid.

## 2017-01-25 NOTE — Telephone Encounter (Signed)
Spoke with Arbie Cookey at [facility].  See documentation 01/19/17.

## 2017-02-11 ENCOUNTER — Encounter: Payer: Self-pay | Admitting: Internal Medicine

## 2017-02-12 ENCOUNTER — Encounter: Payer: Self-pay | Admitting: Internal Medicine

## 2017-02-14 MED ORDER — AMLODIPINE BESYLATE 2.5 MG PO TABS: 2.5000 mg | ORAL_TABLET | Freq: Every day | ORAL | 3 refills | Status: DC

## 2017-02-14 NOTE — Telephone Encounter (Signed)
Called patient in response to her email.   Dr. Harrington Challenger had replied and advised her to stop HCTZ and decrease amlodipine to 2.5 mg daily.  She has also started taking beta blocker at night.   Today BP is 129/69 and she feels good.  She will continue to monitor BP 1-2 times a day only, unless she is feeling weak or dizzy then she will check additionally.  Medication list updated.  She thanked me for calling.  She will email an updated BP list, and is aware to call office for urgent concerns.

## 2017-02-26 ENCOUNTER — Telehealth: Payer: Self-pay | Admitting: Internal Medicine

## 2017-02-26 NOTE — Telephone Encounter (Signed)
New Message  Pt call requesting to speak with RN. Pt states she went to afib around 12:30am this morning. Pt states she stay in afib for about 2 hours. She states she is fine now. Pt would like to know if it would be okay to go back to taking 5mg  of Eliquis for a couple of days. Pt states sh e is currently taking 2.5mg . Please call back to discuss

## 2017-02-26 NOTE — Telephone Encounter (Signed)
Spoke with patient and advised that her eliquis dose is based on her age, weight, and kidney function and that she should not increase the dose without advisement from Dr. Harrington Challenger and/or clinic pharmacist. She states she feels better than she did this morning and is not currently having atrial fib. She verbalized understanding and agreement with plan and thanked me for the call.

## 2017-03-01 ENCOUNTER — Encounter: Payer: Self-pay | Admitting: Internal Medicine

## 2017-03-01 NOTE — Telephone Encounter (Signed)
Patient states she woke up and her arm was numb. After several minutes, the feeling came back after intense "pins and needles" feeling.  During the few minutes to regain feeling in her arm, she sang to herself in the mirror. Her words came out correctly, she had no drooping, and she had no other symptoms. Other than the arm numbness, she has felt fine- completely asymptomatic. She states this has happened a couple times before, with the same feeling and "recovery time," but this was just a little more intense. It only happens in her sleep. Informed patient that since her symptoms went away quickly and she had pins and needles, her arm was likely asleep from sleeping in an awkward position. She understands to call if other symptoms develop and to contact PCP if arm numbness starts to her occur more frequently.  She was grateful for assistance.

## 2017-03-27 DIAGNOSIS — I4891 Unspecified atrial fibrillation: Secondary | ICD-10-CM | POA: Diagnosis not present

## 2017-03-27 DIAGNOSIS — M79672 Pain in left foot: Secondary | ICD-10-CM | POA: Diagnosis not present

## 2017-03-27 DIAGNOSIS — M150 Primary generalized (osteo)arthritis: Secondary | ICD-10-CM | POA: Diagnosis not present

## 2017-03-27 DIAGNOSIS — I10 Essential (primary) hypertension: Secondary | ICD-10-CM | POA: Diagnosis not present

## 2017-04-23 DIAGNOSIS — L821 Other seborrheic keratosis: Secondary | ICD-10-CM | POA: Diagnosis not present

## 2017-04-23 DIAGNOSIS — L578 Other skin changes due to chronic exposure to nonionizing radiation: Secondary | ICD-10-CM | POA: Diagnosis not present

## 2017-04-23 DIAGNOSIS — C44629 Squamous cell carcinoma of skin of left upper limb, including shoulder: Secondary | ICD-10-CM | POA: Diagnosis not present

## 2017-05-18 DIAGNOSIS — M545 Low back pain: Secondary | ICD-10-CM | POA: Diagnosis not present

## 2017-05-18 DIAGNOSIS — M25572 Pain in left ankle and joints of left foot: Secondary | ICD-10-CM | POA: Diagnosis not present

## 2017-06-14 DIAGNOSIS — M542 Cervicalgia: Secondary | ICD-10-CM | POA: Diagnosis not present

## 2017-07-03 ENCOUNTER — Other Ambulatory Visit: Payer: Self-pay | Admitting: Internal Medicine

## 2017-07-03 NOTE — Telephone Encounter (Signed)
Pt's pharmacy is requesting a refill on acebutolol, would you like to refill this medication? Please advise

## 2017-07-04 NOTE — Telephone Encounter (Signed)
Patient has 6 m f/u with APP on 11/13.  OK to refill this.

## 2017-07-08 NOTE — Progress Notes (Signed)
Cardiology Office Note    Date:  07/10/2017   ID:  Stefanie Ferguson, DOB [birth year], MRN [id number]  PCP:  Raina Mina., MD  Cardiologist:  Dr. Harrington Challenger  Chief Complaint: 6 month follow up   History of Present Illness:   Stefanie Ferguson is a 82 y.o. female with hx of PAF, HTN, RAS and symptomatic PVCs presents for follow up.  Normal Coronaries by cath 2007. She was felling fluttering sensation when last seen by Dr. Harrington Challenger 12/2016. Medication adjusted for low blood pressure.   Here today for follow up. BP in 120/70s range at home. Sunday night patient had episode of atrial fibrillation.  No associated symptoms.  She only felt palpitation.  This was her first episode after many months.  Resolve spontaneously within 3-4 hours after drinking water.  She felt she was dehydrated.  She is in lot of stress taking care of 37 year old granddaughter.  She denies orthopnea, PND, syncope, dizziness, chest pain, melena or bloody stool or urine.  Compliant with medication.   Past Medical History:  Diagnosis Date  . Chest pain    normal coronary agniography in 2006 or 2007  . Heart murmur   . Heart palpitations    caused by PVC's  . Hx of echocardiogram 2015   Echo (12/2013):  Mild LVH, EF 55%, no RWMA, Gr 1 DD, mild MR, mild LAE, normal RVF, PASP 23 mmHg  . Hypertension   . Premature ventricular contractions   . Renal artery stenosis ([place])   . Symptomatic PVCs    with palpitations    Past Surgical History:  Procedure Laterality Date  . BREAST SURGERY  2011   excision papilloma left breast  . CARDIAC CATHETERIZATION    . CHOLECYSTECTOMY OPEN  1988  . CYSTOCELE REPAIR  2009   and Rectocele  . ROTATOR CUFF REPAIR  1996/2000   bilateral  . TUBAL LIGATION  1988  . VAGINAL HYSTERECTOMY  2009    Current Medications: Prior to Admission medications   Medication Sig Start Date End Date Taking? Authorizing Provider  acebutolol (SECTRAL) 200 MG capsule Take 1 capsule (200 mg total) daily by  mouth. 07/04/17   Fay Records, MD  acetaminophen (TYLENOL) 325 MG tablet Take 650 mg by mouth every 6 (six) hours as needed for mild pain.     [provider]  amLODipine (NORVASC) 2.5 MG tablet Take 1 tablet (2.5 mg total) by mouth daily. 02/14/17   Fay Records, MD  apixaban (ELIQUIS) 2.5 MG TABS tablet Take 1 tablet (2.5 mg total) by mouth 2 (two) times daily. 01/19/17   Fay Records, MD  Omega 3 1000 MG CAPS Take 1 capsule by mouth.    [provider]  tetrahydrozoline 0.05 % ophthalmic solution Place 2 drops into both eyes 2 (two) times daily.    [provider]    Allergies:   Patient has no known allergies.   Social History   Socioeconomic History  . Marital status: Widowed    Spouse name: None  . Number of children: None  . Years of education: None  . Highest education level: None  Social Needs  . Financial resource strain: None  . Food insecurity - worry: None  . Food insecurity - inability: None  . Transportation needs - medical: None  . Transportation needs - non-medical: None  Occupational History  . None  Tobacco Use  . Smoking status: Never Smoker  . Smokeless tobacco: Never Used  Substance and Sexual Activity  . Alcohol use: No  . Drug use: No  . Sexual activity: None  Other Topics Concern  . None  Social History Narrative  . None     Family History:  The patient's family history includes Aneurysm (age of onset: 41) in her father; Esophageal cancer in her brother; Hypertension in her mother; Pulmonary embolism in her brother; Stomach cancer in her brother; Vascular Disease in her brother.   ROS:   Please see the history of present illness.    ROS All other systems reviewed and are negative.   PHYSICAL EXAM:   VS:  BP (!) 144/84   Pulse 68   Ht 5\' 4"  (1.626 m)   Wt 127 lb 8 oz (57.8 kg)   SpO2 97%   BMI 21.89 kg/m    GEN: Well nourished, well developed, in no acute distress  HEENT: normal  Neck: no JVD, carotid bruits,  or masses Cardiac: RRR; no murmurs, rubs, or gallops,no edema  Respiratory:  clear to auscultation bilaterally, normal work of breathing GI: soft, nontender, nondistended, + BS MS: no deformity or atrophy  Skin: warm and dry, no rash Neuro:  Alert and Oriented x 3, Strength and sensation are intact Psych: euthymic mood, full affect  Wt Readings from Last 3 Encounters:  07/10/17 127 lb 8 oz (57.8 kg)  01/10/17 124 lb 1.9 oz (56.3 kg)  12/31/15 125 lb 12.8 oz (57.1 kg)      Studies/Labs Reviewed:   EKG:  EKG is not ordered today.    Recent Labs: 01/10/2017: BUN 18; Creatinine, Ser 0.72; Hemoglobin 12.5; Platelets 233; Potassium 3.4; Sodium 138; TSH 2.240   Lipid Panel    Component Value Date/Time   CHOL 165 01/10/2017 1449   TRIG 121 01/10/2017 1449   HDL 54 01/10/2017 1449   CHOLHDL 3.1 01/10/2017 1449   CHOLHDL 2.6 12/31/2015 1018   VLDL 21 12/31/2015 1018   LDLCALC 87 01/10/2017 1449    Additional studies/ records that were reviewed today include:   Echocardiogram: 12/2013 Study Conclusions  - Left ventricle: The cavity size was normal. Wall thickness was increased in a pattern of mild LVH. The estimated ejection fraction was 55%. Wall motion was normal; there were no regional wall motion abnormalities. Doppler parameters are consistent with abnormal left ventricular relaxation (grade 1 diastolic dysfunction). - Aortic valve: There was no stenosis. There was trivial regurgitation. - Mitral valve: Mildly calcified annulus. There was mild regurgitation. - Left atrium: The atrium was mildly dilated. - Right ventricle: The cavity size was normal. Systolic function was normal. - Atrial septum: Cannot rule out PFO. - Tricuspid valve: Peak RV-RA gradient (S): 20 mm Hg. - Pulmonary arteries: PA peak pressure: 23 mm Hg (S). - Inferior vena cava: The vessel was normal in size. The respirophasic diameter changes were in the normal range (>=  50%), consistent with normal central venous pressure.  Impressions:  - Normal LV size with mild LV hypertrophy. EF 55%. Normal RV size and systolic function. Mild mitral regurgitation.    ASSESSMENT & PLAN:    1. PAF -Patient had an episode of atrial fibrillation Sunday night after many months.  Resolved spontaneously within a few hours after drinking water.  No associated symptoms.  She is under lots of stress and recently recovered from cold symptoms.  Discussed possible lab work however patient wants to defer for now.  She will give [place] a call if recurrent symptoms.  At that time, consider as  needed medication and further evaluation.  2. HTN -Minimally elevated.  Repeat check 140/70.  Her blood pressure is running normally at home.  Advised to keep reading.    Medication Adjustments/Labs and Tests Ordered: Current medicines are reviewed at length with the patient today.  Concerns regarding medicines are outlined above.  Medication changes, Labs and Tests ordered today are listed in the Patient Instructions below. Patient Instructions  Medication Instructions:  Your physician recommends that you continue on your current medications as directed. Please refer to the Current Medication list given to you today.   Labwork: None ordered  Testing/Procedures: None ordered  Follow-Up: Your physician wants you to follow-up in: [place] DR. ROSS  You will receive a reminder letter in the mail two months in advance. If you don't receive a letter, please call our office to schedule the follow-up appointment.   Any Other Special Instructions Will Be Listed Below (If Applicable).     If you need a refill on your cardiac medications before your next appointment, please call your pharmacy.      Jarrett Soho, [place]  07/10/2017 10:21 AM    [facility] [address], [place], [place]  [address] Phone: [phone]; Fax: [phone]

## 2017-07-10 ENCOUNTER — Ambulatory Visit: Payer: PPO | Admitting: Physician Assistant

## 2017-07-10 ENCOUNTER — Encounter: Payer: Self-pay | Admitting: Physician Assistant

## 2017-07-10 VITALS — BP 144/84 | HR 68 | Ht 64.0 in | Wt 127.5 lb

## 2017-07-10 DIAGNOSIS — I480 Paroxysmal atrial fibrillation: Secondary | ICD-10-CM

## 2017-07-10 DIAGNOSIS — I10 Essential (primary) hypertension: Secondary | ICD-10-CM

## 2017-07-10 NOTE — Patient Instructions (Signed)
Medication Instructions:  Your physician recommends that you continue on your current medications as directed. Please refer to the Current Medication list given to you today.   Labwork: None ordered  Testing/Procedures: None ordered  Follow-Up: Your physician wants you to follow-up in: 6 MONTHS WITH DR. ROSS  You will receive a reminder letter in the mail two months in advance. If you don't receive a letter, please call our office to schedule the follow-up appointment.   Any Other Special Instructions Will Be Listed Below (If Applicable).     If you need a refill on your cardiac medications before your next appointment, please call your pharmacy.   

## 2017-07-25 ENCOUNTER — Other Ambulatory Visit: Payer: Self-pay | Admitting: Internal Medicine

## 2017-07-25 ENCOUNTER — Telehealth: Payer: Self-pay | Admitting: Internal Medicine

## 2017-08-31 DIAGNOSIS — N3090 Cystitis, unspecified without hematuria: Secondary | ICD-10-CM | POA: Diagnosis not present

## 2017-08-31 DIAGNOSIS — R300 Dysuria: Secondary | ICD-10-CM | POA: Diagnosis not present

## 2017-09-26 ENCOUNTER — Other Ambulatory Visit: Payer: Self-pay | Admitting: Internal Medicine

## 2017-10-30 DIAGNOSIS — Z1231 Encounter for screening mammogram for malignant neoplasm of breast: Secondary | ICD-10-CM | POA: Diagnosis not present

## 2017-11-06 DIAGNOSIS — N6019 Diffuse cystic mastopathy of unspecified breast: Secondary | ICD-10-CM | POA: Diagnosis not present

## 2017-11-06 DIAGNOSIS — Z1231 Encounter for screening mammogram for malignant neoplasm of breast: Secondary | ICD-10-CM | POA: Diagnosis not present

## 2017-11-16 ENCOUNTER — Other Ambulatory Visit: Payer: Self-pay | Admitting: *Deleted

## 2017-11-16 DIAGNOSIS — H00026 Hordeolum internum left eye, unspecified eyelid: Secondary | ICD-10-CM | POA: Diagnosis not present

## 2017-11-16 DIAGNOSIS — Z961 Presence of intraocular lens: Secondary | ICD-10-CM | POA: Diagnosis not present

## 2017-11-16 DIAGNOSIS — H00023 Hordeolum internum right eye, unspecified eyelid: Secondary | ICD-10-CM | POA: Diagnosis not present

## 2017-11-16 DIAGNOSIS — H04123 Dry eye syndrome of bilateral lacrimal glands: Secondary | ICD-10-CM | POA: Diagnosis not present

## 2017-11-16 MED ORDER — APIXABAN 2.5 MG PO TABS: 2.5000 mg | ORAL_TABLET | Freq: Two times a day (BID) | ORAL | 3 refills | Status: DC

## 2017-12-05 DIAGNOSIS — R350 Frequency of micturition: Secondary | ICD-10-CM | POA: Diagnosis not present

## 2017-12-05 DIAGNOSIS — R319 Hematuria, unspecified: Secondary | ICD-10-CM | POA: Diagnosis not present

## 2017-12-05 DIAGNOSIS — N952 Postmenopausal atrophic vaginitis: Secondary | ICD-10-CM | POA: Diagnosis not present

## 2017-12-05 DIAGNOSIS — N390 Urinary tract infection, site not specified: Secondary | ICD-10-CM | POA: Diagnosis not present

## 2017-12-05 DIAGNOSIS — R300 Dysuria: Secondary | ICD-10-CM | POA: Diagnosis not present

## 2018-01-22 ENCOUNTER — Ambulatory Visit: Payer: PPO | Admitting: Internal Medicine

## 2018-01-22 ENCOUNTER — Encounter: Payer: Self-pay | Admitting: Internal Medicine

## 2018-01-22 VITALS — BP 146/80 | HR 66 | Ht 64.0 in | Wt 131.8 lb

## 2018-01-22 DIAGNOSIS — E782 Mixed hyperlipidemia: Secondary | ICD-10-CM | POA: Diagnosis not present

## 2018-01-22 DIAGNOSIS — I10 Essential (primary) hypertension: Secondary | ICD-10-CM

## 2018-01-22 DIAGNOSIS — I480 Paroxysmal atrial fibrillation: Secondary | ICD-10-CM | POA: Diagnosis not present

## 2018-01-22 DIAGNOSIS — R5383 Other fatigue: Secondary | ICD-10-CM | POA: Diagnosis not present

## 2018-01-22 MED ORDER — APIXABAN 2.5 MG PO TABS: 2.5000 mg | ORAL_TABLET | Freq: Two times a day (BID) | ORAL | 3 refills | Status: DC

## 2018-01-22 NOTE — Progress Notes (Signed)
Cardiology Office Note   Date:  01/22/2018   ID:  Stefanie Ferguson, DOB 06-Oct-1934, MRN [id number]  PCP:  Raina Mina., MD  Cardiologist:   Dorris Carnes, MD   F/U of CAD and PAF      History of Present Illness: Stefanie Ferguson is a 83 y.o. female with a history of  HTN, PAF, RAS, CV dz  Normal coronary arteries in 2007  I saw her in  May 2018  She complained of some fatigue at that time   Seen by B BHagat in November   Since seen in clinic she is active   She denies fatigue   Breathing is good   No palpitations. Current Meds  Medication Sig  . acebutolol (SECTRAL) 200 MG capsule TAKE 1 CAPSULE BY MOUTH DAILY  . acetaminophen (TYLENOL) 325 MG tablet Take 650 mg by mouth every 6 (six) hours as needed for mild pain.   Marland Kitchen amLODipine (NORVASC) 5 MG tablet TAKE 1 TABLET BY MOUTH ONCE (1) DAILY *NEED APPOINTMENT FOR FURTHER REFILLS*  . tetrahydrozoline 0.05 % ophthalmic solution Place 2 drops into both eyes 2 (two) times daily.     Allergies:   Patient has no known allergies.   Past Medical History:  Diagnosis Date  . Chest pain    normal coronary agniography in 2006 or 2007  . Heart murmur   . Heart palpitations    caused by PVC's  . Hx of echocardiogram 2015   Echo (12/2013):  Mild LVH, EF 55%, no RWMA, Gr 1 DD, mild MR, mild LAE, normal RVF, PASP 23 mmHg  . Hypertension   . Premature ventricular contractions   . Renal artery stenosis ([place])   . Symptomatic PVCs    with palpitations    Past Surgical History:  Procedure Laterality Date  . BREAST SURGERY  2011   excision papilloma left breast  . CARDIAC CATHETERIZATION    . CHOLECYSTECTOMY OPEN  1988  . CYSTOCELE REPAIR  2009   and Rectocele  . ROTATOR CUFF REPAIR  1996/2000   bilateral  . TUBAL LIGATION  1988  . VAGINAL HYSTERECTOMY  2009     Social History:  The patient  reports that she has never smoked. She has never used smokeless tobacco. She reports that she does not drink alcohol or use drugs.   Family  History:  The patient's family history includes Aneurysm (age of onset: 59) in her father; Esophageal cancer in her brother; Hypertension in her mother; Pulmonary embolism in her brother; Stomach cancer in her brother; Vascular Disease in her brother.    ROS:  Please see the history of present illness. All other systems are reviewed and  Negative to the above problem except as noted.    PHYSICAL EXAM: VS:  BP (!) 146/80   Pulse 66   Ht 5\' 4"  (1.626 m)   Wt 59.8 kg (131 lb 12.8 oz)   BMI 22.62 kg/m   GEN: Well nourished, well developed, in no acute distress  HEENT: normal  Neck: JVP is normal  No, carotid bruits, or masses Cardiac: RRR; no murmurs, rubs, or gallops,no edema  Respiratory:  clear to auscultation bilaterally, normal work of breathing GI: soft, nontender, nondistended, + BS  No hepatomegaly  MS: no deformity Moving all extremities   Skin: warm and dry, no rash Neuro:  Strength and sensation are intact Psych: euthymic mood, full affect   EKG:  EKG is not ordered today   Lipid Panel  Component Value Date/Time   CHOL 165 01/10/2017 1449   TRIG 121 01/10/2017 1449   HDL 54 01/10/2017 1449   CHOLHDL 3.1 01/10/2017 1449   CHOLHDL 2.6 12/31/2015 1018   VLDL 21 12/31/2015 1018   LDLCALC 87 01/10/2017 1449      Wt Readings from Last 3 Encounters:  01/22/18 59.8 kg (131 lb 12.8 oz)  07/10/17 57.8 kg (127 lb 8 oz)  01/10/17 56.3 kg (124 lb 1.9 oz)      ASSESSMENT AND PLAN:  1 Fatigue  Denies   Pt is very active    2  PAF  Rare palpitations  Keep on current meds     3    HL Will check lipoids      4  HTN    BP better at home   110s to 130s    Keep on current regimen    F/U in the winter   Current medicines are reviewed at length with the patient today.  The patient does not have concerns regarding medicines.  Signed, Dorris Carnes, MD  01/22/2018 8:40 AM    [facility] [address], [place], [place]  [address] Phone: [phone]; Fax: [phone]

## 2018-01-22 NOTE — Patient Instructions (Signed)
Your physician recommends that you continue on your current medications as directed. Please refer to the Current Medication list given to you today. Your physician recommends that you return for lab work in: today (cmet, cbc, tsh, vit d, lipid) Your physician wants you to follow-up in: December, 2019 with Dr. Harrington Challenger.  You will receive a reminder letter in the mail two months in advance. If you don't receive a letter, please call our office to schedule the follow-up appointment.

## 2018-01-23 LAB — LIPID PANEL
Chol/HDL Ratio: 3 ratio (ref 0.0–4.4)
Cholesterol, Total: 174 mg/dL (ref 100–199)
HDL: 58 mg/dL (ref >39)
LDL Calculated: 100 mg/dL — ABNORMAL HIGH (ref 0–99)
TRIGLYCERIDES: 82 mg/dL (ref 0–149)
VLDL Cholesterol Cal: 16 mg/dL (ref 5–40)

## 2018-01-23 LAB — CBC
Hematocrit: 39.6 % (ref 34.0–46.6)
Hemoglobin: 13.2 g/dL (ref 11.1–15.9)
MCH: 29.5 pg (ref 26.6–33.0)
MCHC: 33.3 g/dL (ref 31.5–35.7)
MCV: 88 fL (ref 79–97)
PLATELETS: 209 10*3/uL (ref 150–450)
RBC: 4.48 x10E6/uL (ref 3.77–5.28)
RDW: 14.7 % (ref 12.3–15.4)
WBC: 3.8 10*3/uL (ref 3.4–10.8)

## 2018-01-23 LAB — COMPREHENSIVE METABOLIC PANEL
ALK PHOS: 91 IU/L (ref 39–117)
ALT: 13 IU/L (ref 0–32)
AST: 20 IU/L (ref 0–40)
Albumin/Globulin Ratio: 1.5 (ref 1.2–2.2)
Albumin: 4.2 g/dL (ref 3.5–4.7)
BILIRUBIN TOTAL: 1 mg/dL (ref 0.0–1.2)
BUN/Creatinine Ratio: 22 (ref 12–28)
BUN: 14 mg/dL (ref 8–27)
CHLORIDE: 101 mmol/L (ref 96–106)
CO2: 24 mmol/L (ref 20–29)
Calcium: 9.3 mg/dL (ref 8.7–10.3)
Creatinine, Ser: 0.64 mg/dL (ref 0.57–1.00)
GFR calc Af Amer: 97 mL/min/{1.73_m2} (ref >59)
GFR calc non Af Amer: 84 mL/min/{1.73_m2} (ref >59)
GLUCOSE: 95 mg/dL (ref 65–99)
Globulin, Total: 2.8 g/dL (ref 1.5–4.5)
POTASSIUM: 4.4 mmol/L (ref 3.5–5.2)
Sodium: 140 mmol/L (ref 134–144)
Total Protein: 7 g/dL (ref 6.0–8.5)

## 2018-01-23 LAB — VITAMIN D 25 HYDROXY (VIT D DEFICIENCY, FRACTURES): VIT D 25 HYDROXY: 24.4 ng/mL — AB (ref 30.0–100.0)

## 2018-01-23 LAB — TSH: TSH: 2.8 u[IU]/mL (ref 0.450–4.500)

## 2018-01-28 ENCOUNTER — Other Ambulatory Visit: Payer: Self-pay | Admitting: *Deleted

## 2018-01-28 DIAGNOSIS — R7989 Other specified abnormal findings of blood chemistry: Secondary | ICD-10-CM

## 2018-01-28 MED ORDER — VITAMIN D 50 MCG (2000 UT) PO TABS: 4000.0000 [IU] | ORAL_TABLET | Freq: Every day | ORAL | 0 refills | Status: DC

## 2018-01-28 NOTE — Progress Notes (Signed)
Notes recorded by Thompson Grayer, RN on 01/28/2018 at 1:45 PM EDT Pt notified. She will come in for VitD level on 05/01/18 ------  Notes recorded by Thompson Grayer, RN on 01/25/2018 at 1:39 PM EDT Left message to call back ------  Notes recorded by Fay Records, MD on 01/24/2018 at 11:26 PM EDT CBC is normal Electrolytes, kidney and liver function noraml Thyroid function normal  LDL OK at 100 Vit D is low  Would recomm 4000 IU per day F/U Vit D in 4 months ------

## 2018-03-23 ENCOUNTER — Other Ambulatory Visit: Payer: Self-pay | Admitting: Internal Medicine

## 2018-03-26 DIAGNOSIS — Z0000 Encounter for general adult medical examination without abnormal findings: Secondary | ICD-10-CM | POA: Diagnosis not present

## 2018-03-26 DIAGNOSIS — M150 Primary generalized (osteo)arthritis: Secondary | ICD-10-CM | POA: Diagnosis not present

## 2018-03-26 DIAGNOSIS — R739 Hyperglycemia, unspecified: Secondary | ICD-10-CM | POA: Diagnosis not present

## 2018-03-26 DIAGNOSIS — N6019 Diffuse cystic mastopathy of unspecified breast: Secondary | ICD-10-CM | POA: Diagnosis not present

## 2018-03-26 DIAGNOSIS — I773 Arterial fibromuscular dysplasia: Secondary | ICD-10-CM | POA: Diagnosis not present

## 2018-03-26 DIAGNOSIS — Z1159 Encounter for screening for other viral diseases: Secondary | ICD-10-CM | POA: Diagnosis not present

## 2018-03-26 DIAGNOSIS — I4891 Unspecified atrial fibrillation: Secondary | ICD-10-CM | POA: Diagnosis not present

## 2018-03-26 DIAGNOSIS — I701 Atherosclerosis of renal artery: Secondary | ICD-10-CM | POA: Diagnosis not present

## 2018-03-26 DIAGNOSIS — Z79899 Other long term (current) drug therapy: Secondary | ICD-10-CM | POA: Diagnosis not present

## 2018-03-26 DIAGNOSIS — I10 Essential (primary) hypertension: Secondary | ICD-10-CM | POA: Diagnosis not present

## 2018-03-26 DIAGNOSIS — E782 Mixed hyperlipidemia: Secondary | ICD-10-CM | POA: Diagnosis not present

## 2018-03-26 DIAGNOSIS — M8580 Other specified disorders of bone density and structure, unspecified site: Secondary | ICD-10-CM | POA: Diagnosis not present

## 2018-03-26 DIAGNOSIS — I493 Ventricular premature depolarization: Secondary | ICD-10-CM | POA: Diagnosis not present

## 2018-04-02 ENCOUNTER — Other Ambulatory Visit: Payer: Self-pay | Admitting: Internal Medicine

## 2018-04-23 DIAGNOSIS — L578 Other skin changes due to chronic exposure to nonionizing radiation: Secondary | ICD-10-CM | POA: Diagnosis not present

## 2018-04-23 DIAGNOSIS — L821 Other seborrheic keratosis: Secondary | ICD-10-CM | POA: Diagnosis not present

## 2018-04-25 ENCOUNTER — Telehealth: Payer: Self-pay | Admitting: Internal Medicine

## 2018-04-25 NOTE — Telephone Encounter (Signed)
Routing to Triage since this is Dr. Harrington Challenger' patient and her primary nurse is out of the office today

## 2018-04-25 NOTE — Telephone Encounter (Signed)
New Message:   Pt states she was not feeling well last night and requesting to speak to a nurse.

## 2018-04-25 NOTE — Telephone Encounter (Signed)
Pt called to report that she felt her heart was out of rhythm last night for about 8 hours.. She said her rate on her BP cuff peaked at 128..she denied any sob, dizziness with it. She said she feels a lot better today just feels washed out.Marland Kitchen Her heart rate is back to 79 and BP 130/68.Marland Kitchen She reported that she only takes her Norvasc a couple times a week because her BP has been running low such as 110/60.Marland Kitchen Her heart rate is always in the 70's. She also says that she is still taking her Eliquis bid. I advised her that I will forward to Dr. Harrington Challenger and see if she has any recommendations. I asked her to rest and call us if anything changes but good that she is feeling better. Pt next OV is in 12/19 with Richardson Dopp PA. Pt agrees and will call back if anything changes.

## 2018-05-01 ENCOUNTER — Other Ambulatory Visit: Payer: PPO | Admitting: *Deleted

## 2018-05-01 DIAGNOSIS — R7989 Other specified abnormal findings of blood chemistry: Secondary | ICD-10-CM

## 2018-05-02 LAB — VITAMIN D 25 HYDROXY (VIT D DEFICIENCY, FRACTURES): Vit D, 25-Hydroxy: 62.2 ng/mL (ref 30.0–100.0)

## 2018-05-05 NOTE — Telephone Encounter (Signed)
No new recommendations

## 2018-06-12 ENCOUNTER — Ambulatory Visit: Payer: PPO | Admitting: Physician Assistant

## 2018-06-12 ENCOUNTER — Encounter

## 2018-06-13 DIAGNOSIS — I4891 Unspecified atrial fibrillation: Secondary | ICD-10-CM | POA: Diagnosis not present

## 2018-06-13 DIAGNOSIS — I493 Ventricular premature depolarization: Secondary | ICD-10-CM | POA: Diagnosis not present

## 2018-08-06 ENCOUNTER — Ambulatory Visit: Payer: PPO | Admitting: Physician Assistant

## 2018-08-17 DIAGNOSIS — M542 Cervicalgia: Secondary | ICD-10-CM | POA: Diagnosis not present

## 2018-08-26 DIAGNOSIS — R51 Headache: Secondary | ICD-10-CM | POA: Diagnosis not present

## 2018-08-26 DIAGNOSIS — R410 Disorientation, unspecified: Secondary | ICD-10-CM | POA: Diagnosis not present

## 2018-08-30 DIAGNOSIS — I773 Arterial fibromuscular dysplasia: Secondary | ICD-10-CM | POA: Diagnosis not present

## 2018-08-30 DIAGNOSIS — I701 Atherosclerosis of renal artery: Secondary | ICD-10-CM | POA: Diagnosis not present

## 2018-08-30 DIAGNOSIS — R200 Anesthesia of skin: Secondary | ICD-10-CM | POA: Diagnosis not present

## 2018-08-30 DIAGNOSIS — M4802 Spinal stenosis, cervical region: Secondary | ICD-10-CM | POA: Diagnosis not present

## 2018-08-30 DIAGNOSIS — E782 Mixed hyperlipidemia: Secondary | ICD-10-CM | POA: Diagnosis not present

## 2018-08-30 DIAGNOSIS — M50223 Other cervical disc displacement at C6-C7 level: Secondary | ICD-10-CM | POA: Diagnosis not present

## 2018-08-30 DIAGNOSIS — I6523 Occlusion and stenosis of bilateral carotid arteries: Secondary | ICD-10-CM | POA: Diagnosis not present

## 2018-08-30 DIAGNOSIS — J323 Chronic sphenoidal sinusitis: Secondary | ICD-10-CM | POA: Diagnosis not present

## 2018-08-30 DIAGNOSIS — I10 Essential (primary) hypertension: Secondary | ICD-10-CM | POA: Diagnosis not present

## 2018-08-30 DIAGNOSIS — M50222 Other cervical disc displacement at C5-C6 level: Secondary | ICD-10-CM | POA: Diagnosis not present

## 2018-08-30 DIAGNOSIS — M5412 Radiculopathy, cervical region: Secondary | ICD-10-CM | POA: Diagnosis not present

## 2018-08-30 DIAGNOSIS — R51 Headache: Secondary | ICD-10-CM | POA: Diagnosis not present

## 2018-08-30 DIAGNOSIS — I480 Paroxysmal atrial fibrillation: Secondary | ICD-10-CM | POA: Diagnosis not present

## 2018-09-05 DIAGNOSIS — E042 Nontoxic multinodular goiter: Secondary | ICD-10-CM | POA: Diagnosis not present

## 2018-09-05 DIAGNOSIS — E049 Nontoxic goiter, unspecified: Secondary | ICD-10-CM | POA: Diagnosis not present

## 2018-10-07 DIAGNOSIS — L820 Inflamed seborrheic keratosis: Secondary | ICD-10-CM | POA: Diagnosis not present

## 2018-10-07 DIAGNOSIS — D2372 Other benign neoplasm of skin of left lower limb, including hip: Secondary | ICD-10-CM | POA: Diagnosis not present

## 2018-10-08 ENCOUNTER — Other Ambulatory Visit: Payer: Self-pay | Admitting: Internal Medicine

## 2018-11-07 DIAGNOSIS — Z1231 Encounter for screening mammogram for malignant neoplasm of breast: Secondary | ICD-10-CM | POA: Diagnosis not present

## 2019-01-08 DIAGNOSIS — L03116 Cellulitis of left lower limb: Secondary | ICD-10-CM | POA: Diagnosis not present

## 2019-03-10 DIAGNOSIS — N6011 Diffuse cystic mastopathy of right breast: Secondary | ICD-10-CM | POA: Diagnosis not present

## 2019-03-10 DIAGNOSIS — N6012 Diffuse cystic mastopathy of left breast: Secondary | ICD-10-CM | POA: Diagnosis not present

## 2019-04-07 ENCOUNTER — Other Ambulatory Visit: Payer: Self-pay | Admitting: Internal Medicine

## 2019-04-07 NOTE — Telephone Encounter (Signed)
Pt last saw Dr Harrington Challenger 01/22/18, overdue for follow-up cancelled appt in December 2019 and never rescheduled.  Attempted to call pt to schedule f/u OV, no answer and VM not set up. Last labs 03/26/18 Creat 0.76 [place] per [facility], overdue for labwork as well.  Age 84, weight 59.8kg, based on specified criteria pt is on appropriate dosage of Eliquis 2.5mg  BID.  Pt is overdue for both f/u with Dr Harrington Challenger and Worthy Keeler.  Will refill x 1 only with note on refill must schedule OV for future refills.

## 2019-04-25 ENCOUNTER — Other Ambulatory Visit: Payer: Self-pay | Admitting: Internal Medicine

## 2019-04-25 NOTE — Telephone Encounter (Signed)
Pt last saw Dr Harrington Challenger on 01/22/18, Eliquis last refilled on 04/07/19 60 tabs with 1 refill with a note pt must schedule OV for future refills.  Attempted to contact pt to get appt scheduled, NA and unable to leave voicemail.  Pt should still have 1 refill and a 90 day supply is not appropriate at this time secondary to pt being overdue for follow-up. Will attempt to contact pt again to make her aware she needs to schedule follow-up appt with Dr Harrington Challenger prior to refilling rx.

## 2019-04-25 NOTE — Telephone Encounter (Signed)
New Message   *STAT* If patient is at the pharmacy, call can be transferred to refill team.   1. Which medications need to be refilled? (please list name of each medication and dose if known) apixaban (ELIQUIS) 2.5 MG TABS tablet  2. Which pharmacy/location (including street and city if local pharmacy) is medication to be sent to? [place], [place]  3. Do they need a 30 day or 90 day supply? 90 day

## 2019-04-29 NOTE — Telephone Encounter (Signed)
We have attempted to contact pt multiple times, NA and unable to leave a message.  Will refuse medication at pharmacy with note for pt to call our office to schedule office visit.

## 2019-05-07 ENCOUNTER — Other Ambulatory Visit: Payer: Self-pay | Admitting: Internal Medicine

## 2019-05-07 MED ORDER — APIXABAN 2.5 MG PO TABS: 2.5000 mg | ORAL_TABLET | Freq: Two times a day (BID) | ORAL | 1 refills | Status: DC

## 2019-05-07 NOTE — Telephone Encounter (Signed)
Pt last saw Dr Harrington Challenger 01/22/18, overdue for follow-up cancelled appt in December 2019 and never rescheduled.  Attempted to call pt to schedule f/u OV, no answer and VM not set up. Last labs 03/26/18 Creat 0.76 [place] per [facility], overdue for labwork as well.  Age 84, weight 59.8kg, based on specified criteria pt is on appropriate dosage of Eliquis 2.5mg  BID.  Pt is overdue for both f/u with Dr Harrington Challenger and Worthy Keeler.  Pt did schedule virtual office visit with Dr Harrington Challenger for 05/26/19 will refill x 1 to get pt to appt. Will need labwork scheduled as well, will place note on appt to schedule labwork.

## 2019-05-07 NOTE — Telephone Encounter (Signed)
 *  STAT* If patient is at the pharmacy, call can be transferred to refill team.   1. Which medications need to be refilled? (please list name of each medication and dose if known) apixaban (ELIQUIS) 2.5 MG TABS tablet  2. Which pharmacy/location (including street and city if local pharmacy) is medication to be sent to? [facility]  3. Do they need a 30 day or 90 day supply? 30  Patient has appt on 05/26/19

## 2019-05-23 ENCOUNTER — Telehealth: Payer: Self-pay

## 2019-05-23 NOTE — Telephone Encounter (Signed)
Spoke with pt and reviewed meds and consent given. Informed pt to have vitals done prior to appt with Dr. Harrington Challenger. Pt verbalized understanding and thanked me for the call.     Virtual Visit Pre-Appointment Phone Call  "(Name), I am calling you today to discuss your upcoming appointment. We are currently trying to limit exposure to the virus that causes COVID-19 by seeing patients at home rather than in the office."  1. "What is the BEST phone number to call the day of the visit?" - include this in appointment notes  2. "Do you have or have access to (through a family member/friend) a smartphone with video capability that we can use for your visit?" a. If yes - list this number in appt notes as "cell" (if different from BEST phone #) and list the appointment type as a VIDEO visit in appointment notes b. If no - list the appointment type as a PHONE visit in appointment notes  3. Confirm consent - "In the setting of the current Covid19 crisis, you are scheduled for a (phone or video) visit with your provider on (date) at (time).  Just as we do with many in-office visits, in order for you to participate in this visit, we must obtain consent.  If you'd like, I can send this to your mychart (if signed up) or email for you to review.  Otherwise, I can obtain your verbal consent now.  All virtual visits are billed to your insurance company just like a normal visit would be.  By agreeing to a virtual visit, we'd like you to understand that the technology does not allow for your provider to perform an examination, and thus may limit your provider's ability to fully assess your condition. If your provider identifies any concerns that need to be evaluated in person, we will make arrangements to do so.  Finally, though the technology is pretty good, we cannot assure that it will always work on either your or our end, and in the setting of a video visit, we may have to convert it to a phone-only visit.  In either  situation, we cannot ensure that we have a secure connection.  Are you willing to proceed?" STAFF: Did the patient verbally acknowledge consent to telehealth visit? Document YES/NO here: YES  4. Advise patient to be prepared - "Two hours prior to your appointment, go ahead and check your blood pressure, pulse, oxygen saturation, and your weight (if you have the equipment to check those) and write them all down. When your visit starts, your provider will ask you for this information. If you have an Apple Watch or Kardia device, please plan to have heart rate information ready on the day of your appointment. Please have a pen and paper handy nearby the day of the visit as well."  5. Give patient instructions for MyChart download to smartphone OR Doximity/Doxy.me as below if video visit (depending on what platform provider is using)  6. Inform patient they will receive a phone call 15 minutes prior to their appointment time (may be from unknown caller ID) so they should be prepared to answer    Stefanie Ferguson has been deemed a candidate for a follow-up tele-health visit to limit community exposure during the Covid-19 pandemic. I spoke with the patient via phone to ensure availability of phone/video source, confirm preferred email & phone number, and discuss instructions and expectations.  I reminded Stefanie Ferguson to be prepared with any  vital sign and/or heart rhythm information that could potentially be obtained via home monitoring, at the time of her visit. I reminded Stefanie Ferguson to expect a phone call prior to her visit.  Stefanie Ferguson, [place] 05/23/2019 4:24 PM   INSTRUCTIONS FOR DOWNLOADING THE MYCHART APP TO SMARTPHONE  - The patient must first make sure to have activated MyChart and know their login information - If Apple, go to [employer] and type in MyChart in the search bar and download the app. If Android, ask patient to go to [employer] and type in  [place] in the search bar and download the app. The app is free but as with any other app downloads, their phone may require them to verify saved payment information or Apple/Android password.  - The patient will need to then log into the app with their MyChart username and password, and select [facility] as their healthcare provider to link the account. When it is time for your visit, go to the MyChart app, find appointments, and click Begin Video Visit. Be sure to Select Allow for your device to access the Microphone and Camera for your visit. You will then be connected, and your provider will be with you shortly.  **If they have any issues connecting, or need assistance please contact MyChart service desk [phone])**  **If using a computer, in order to ensure the best quality for their visit they will need to use either of the following Internet Browsers: [employer], or Google Chrome**  IF USING DOXIMITY or DOXY.ME - The patient will receive a link just prior to their visit by text.     FULL LENGTH CONSENT FOR TELE-HEALTH VISIT   I hereby voluntarily request, consent and authorize [facility] and its employed or contracted physicians, physician assistants, nurse practitioners or other licensed health care professionals (the Practitioner), to provide me with telemedicine health care services (the "Services") as deemed necessary by the treating Practitioner. I acknowledge and consent to receive the Services by the Practitioner via telemedicine. I understand that the telemedicine visit will involve communicating with the Practitioner through live audiovisual communication technology and the disclosure of certain medical information by electronic transmission. I acknowledge that I have been given the opportunity to request an in-person assessment or other available alternative prior to the telemedicine visit and am voluntarily participating in the telemedicine visit.  I  understand that I have the right to withhold or withdraw my consent to the use of telemedicine in the course of my care at any time, without affecting my right to future care or treatment, and that the Practitioner or I may terminate the telemedicine visit at any time. I understand that I have the right to inspect all information obtained and/or recorded in the course of the telemedicine visit and may receive copies of available information for a reasonable fee.  I understand that some of the potential risks of receiving the Services via telemedicine include:  Marland Kitchen Delay or interruption in medical evaluation due to technological equipment failure or disruption; . Information transmitted may not be sufficient (e.g. poor resolution of images) to allow for appropriate medical decision making by the Practitioner; and/or  . In rare instances, security protocols could fail, causing a breach of personal health information.  Furthermore, I acknowledge that it is my responsibility to provide information about my medical history, conditions and care that is complete and accurate to the best of my ability. I acknowledge that Practitioner's advice, recommendations, and/or  decision may be based on factors not within their control, such as incomplete or inaccurate data provided by me or distortions of diagnostic images or specimens that may result from electronic transmissions. I understand that the practice of medicine is not an exact science and that Practitioner makes no warranties or guarantees regarding treatment outcomes. I acknowledge that I will receive a copy of this consent concurrently upon execution via email to the email address I last provided but may also request a printed copy by calling the office of [facility].    I understand that my insurance will be billed for this visit.   I have read or had this consent read to me. . I understand the contents of this consent, which adequately explains the  benefits and risks of the Services being provided via telemedicine.  . I have been provided ample opportunity to ask questions regarding this consent and the Services and have had my questions answered to my satisfaction. . I give my informed consent for the services to be provided through the use of telemedicine in my medical care  By participating in this telemedicine visit I agree to the above.

## 2019-05-26 ENCOUNTER — Other Ambulatory Visit: Payer: Self-pay

## 2019-05-26 ENCOUNTER — Telehealth (INDEPENDENT_AMBULATORY_CARE_PROVIDER_SITE_OTHER): Payer: PPO | Admitting: Internal Medicine

## 2019-05-26 VITALS — BP 138/70 | HR 72 | Wt 135.0 lb

## 2019-05-26 DIAGNOSIS — I493 Ventricular premature depolarization: Secondary | ICD-10-CM

## 2019-05-26 DIAGNOSIS — I480 Paroxysmal atrial fibrillation: Secondary | ICD-10-CM

## 2019-05-26 DIAGNOSIS — I10 Essential (primary) hypertension: Secondary | ICD-10-CM

## 2019-05-26 DIAGNOSIS — R7989 Other specified abnormal findings of blood chemistry: Secondary | ICD-10-CM

## 2019-05-26 DIAGNOSIS — E782 Mixed hyperlipidemia: Secondary | ICD-10-CM

## 2019-05-26 MED ORDER — ACEBUTOLOL HCL 200 MG PO CAPS: 200.0000 mg | ORAL_CAPSULE | Freq: Every day | ORAL | 3 refills | Status: DC

## 2019-05-26 MED ORDER — ROSUVASTATIN CALCIUM 5 MG PO TABS: 2.5000 mg | ORAL_TABLET | Freq: Every day | ORAL | 3 refills | Status: DC

## 2019-05-26 MED ORDER — APIXABAN 2.5 MG PO TABS: 2.5000 mg | ORAL_TABLET | Freq: Two times a day (BID) | ORAL | 5 refills | Status: DC

## 2019-05-26 MED ORDER — AMLODIPINE BESYLATE 5 MG PO TABS: 5.0000 mg | ORAL_TABLET | Freq: Every day | ORAL | 3 refills | Status: DC

## 2019-05-26 NOTE — Progress Notes (Signed)
Virtual Visit via Telephone Note   This visit type was conducted due to national recommendations for restrictions regarding the COVID-19 Pandemic (e.g. social distancing) in an effort to limit this patient's exposure and mitigate transmission in our community.  Due to her co-morbid illnesses, this patient is at least at moderate risk for complications without adequate follow up.  This format is felt to be most appropriate for this patient at this time.  The patient did not have access to video technology/had technical difficulties with video requiring transitioning to audio format only (telephone).  All issues noted in this document were discussed and addressed.  No physical exam could be performed with this format.  Please refer to the patient's chart for her  consent to telehealth for [facility].   Date:  05/26/2019   ID:  Stefanie Ferguson, DOB 09/03/1934, MRN [id number]  Patient Location: Home Provider Location: Home  PCP:  Raina Mina., MD  Cardiologist: Harrington Challenger  Chief Complaint:  Pt presents for f/u of PAF  History of Present Illness:    Stefanie Ferguson is a 84 y.o. female with a hx o HTN, PAF, RAS PVC and mild CV dz   Normal cath in 2007   I last saw her in clinic in Aug 2019    The pt says that her breathing is good    She woke up a few wks ago   HR 120s    Took an additional acebutelol   Went back to sleep   HR went down   Pt denies dizziness  No symptoms When not having spells feels OK   Active   The patient does not have symptoms concerning for COVID-19 infection (fever, chills, cough, or new shortness of breath).    Past Medical History:  Diagnosis Date  . Chest pain    normal coronary agniography in 2006 or 2007  . Heart murmur   . Heart palpitations    caused by PVC's  . Hx of echocardiogram 2015   Echo (12/2013):  Mild LVH, EF 55%, no RWMA, Gr 1 DD, mild MR, mild LAE, normal RVF, PASP 23 mmHg  . Hypertension   . Premature ventricular contractions   . Renal  artery stenosis ([place])   . Symptomatic PVCs    with palpitations   Past Surgical History:  Procedure Laterality Date  . BREAST SURGERY  2011   excision papilloma left breast  . CARDIAC CATHETERIZATION    . CHOLECYSTECTOMY OPEN  1988  . CYSTOCELE REPAIR  2009   and Rectocele  . ROTATOR CUFF REPAIR  1996/2000   bilateral  . TUBAL LIGATION  1988  . VAGINAL HYSTERECTOMY  2009     No outpatient medications have been marked as taking for the 05/26/19 encounter (Telemedicine) with Fay Records, MD.     Allergies:   Patient has no known allergies.   Social History   Tobacco Use  . Smoking status: Never Smoker  . Smokeless tobacco: Never Used  Substance Use Topics  . Alcohol use: No  . Drug use: No     Family Hx: The patient's family history includes Aneurysm (age of onset: 36) in her father; Esophageal cancer in her brother; Hypertension in her mother; Pulmonary embolism in her brother; Stomach cancer in her brother; Vascular Disease in her brother.  ROS:   Please see the history of present illness.     All other systems reviewed and are negative.     Labs/Other Tests and  Data Reviewed:    EKG:  No ECG reviewed.  Recent Labs: No results found for requested labs within last 8760 hours.   Recent Lipid Panel Lab Results  Component Value Date/Time   CHOL 174 01/22/2018 09:22 AM   TRIG 82 01/22/2018 09:22 AM   HDL 58 01/22/2018 09:22 AM   CHOLHDL 3.0 01/22/2018 09:22 AM   CHOLHDL 2.6 12/31/2015 10:18 AM   LDLCALC 100 (H) 01/22/2018 09:22 AM    Wt Readings from Last 3 Encounters:  05/26/19 135 lb (61.2 kg)  01/22/18 131 lb 12.8 oz (59.8 kg)  07/10/17 127 lb 8 oz (57.8 kg)     Objective:    Vital Signs:  BP 138/70   Pulse 72   Wt 135 lb (61.2 kg)   BMI 23.17 kg/m    VITAL SIGNS:  reviewed  ASSESSMENT & PLAN:    1. Palpitations   WIll set up for monitor to try to capture what she has felt   2  PAF   Monitor   CHeck labs   Continue apixiban  3  HTN   BP is adequate     4  Hx PVCs   Monitor  5   Hx HL   On Crestor  Check lipids     CheckCBC, CMET, TSH, lpids and Vit D  COVID-19 Education: The signs and symptoms of COVID-19 were discussed with the patient and how to seek care for testing (follow up with PCP or arrange E-visit).  The importance of social distancing was discussed today.  Time:   Today, I have spent 25  minutes with the patient with telehealth technology discussing the above problems.     Medication Adjustments/Labs and Tests Ordered: Current medicines are reviewed at length with the patient today.  Concerns regarding medicines are outlined above.   Tests Ordered: No orders of the defined types were placed in this encounter.   Medication Changes: No orders of the defined types were placed in this encounter.   Follow Up  1 year   : Dorris Carnes, MD  05/26/2019 9:28 AM    [place]

## 2019-05-26 NOTE — Patient Instructions (Signed)
Medication Instructions:  No changes If you need a refill on your cardiac medications before your next appointment, please call your pharmacy.   Lab work: Your physician recommends that you return for lab work (cbc, cmet, lipids, tsh, vitD)  If you have labs (blood work) drawn today and your tests are completely normal, you will receive your results only by: Marland Kitchen MyChart Message (if you have MyChart) OR . A paper copy in the mail If you have any lab test that is abnormal or we need to change your treatment, we will call you to review the results.  Testing/Procedures: Your physician has recommended that you wear a heart monitor.(ZIO PATCH)  Heart monitors are medical devices that record the heart's electrical activity. Doctors most often use these monitors to diagnose arrhythmias. Arrhythmias are problems with the speed or rhythm of the heartbeat. The monitor is a small, portable device. You can wear one while you do your normal daily activities. This is usually used to diagnose what is causing palpitations/syncope (passing out).  Follow-Up: At [facility], you and your health needs are our priority.  As part of our continuing mission to provide you with exceptional heart care, we have created designated Provider Care Teams.  These Care Teams include your primary Cardiologist (physician) and Advanced Practice Providers (APPs -  Physician Assistants and Nurse Practitioners) who all work together to provide you with the care you need, when you need it. You will need a follow up appointment in:  12 months.  Please call our office 2 months in advance to schedule this appointment.  You may see Dr. Dorris Carnes or one of the following Advanced Practice Providers on your designated Care Team: Richardson Dopp, PA-C [place], [place] . Daune Perch, NP  Any Other Special Instructions Will Be Listed Below (If Applicable).

## 2019-05-28 ENCOUNTER — Telehealth: Payer: Self-pay | Admitting: *Deleted

## 2019-05-28 NOTE — Telephone Encounter (Signed)
14 day ZIO XT long term holter monitor to be mailed to [place], [place], [place] [address].  Instructions reviewed briefly as they are included in the monitor kit.

## 2019-06-02 ENCOUNTER — Ambulatory Visit (INDEPENDENT_AMBULATORY_CARE_PROVIDER_SITE_OTHER): Payer: PPO

## 2019-06-02 ENCOUNTER — Other Ambulatory Visit: Payer: Self-pay

## 2019-06-02 ENCOUNTER — Other Ambulatory Visit: Payer: PPO | Admitting: *Deleted

## 2019-06-02 DIAGNOSIS — I493 Ventricular premature depolarization: Secondary | ICD-10-CM | POA: Diagnosis not present

## 2019-06-02 DIAGNOSIS — E782 Mixed hyperlipidemia: Secondary | ICD-10-CM

## 2019-06-02 DIAGNOSIS — R7989 Other specified abnormal findings of blood chemistry: Secondary | ICD-10-CM

## 2019-06-02 DIAGNOSIS — I480 Paroxysmal atrial fibrillation: Secondary | ICD-10-CM

## 2019-06-03 LAB — VITAMIN D 25 HYDROXY (VIT D DEFICIENCY, FRACTURES): Vit D, 25-Hydroxy: 28.5 ng/mL — ABNORMAL LOW (ref 30.0–100.0)

## 2019-06-03 LAB — COMPREHENSIVE METABOLIC PANEL
ALT: 10 IU/L (ref 0–32)
AST: 19 IU/L (ref 0–40)
Albumin/Globulin Ratio: 1.6 (ref 1.2–2.2)
Albumin: 4.2 g/dL (ref 3.6–4.6)
Alkaline Phosphatase: 94 IU/L (ref 39–117)
BUN/Creatinine Ratio: 22 (ref 12–28)
BUN: 16 mg/dL (ref 8–27)
Bilirubin Total: 1.1 mg/dL (ref 0.0–1.2)
CO2: 26 mmol/L (ref 20–29)
Calcium: 9.5 mg/dL (ref 8.7–10.3)
Chloride: 102 mmol/L (ref 96–106)
Creatinine, Ser: 0.72 mg/dL (ref 0.57–1.00)
GFR calc Af Amer: 90 mL/min/{1.73_m2} (ref >59)
GFR calc non Af Amer: 78 mL/min/{1.73_m2} (ref >59)
Globulin, Total: 2.7 g/dL (ref 1.5–4.5)
Glucose: 98 mg/dL (ref 65–99)
Potassium: 4.5 mmol/L (ref 3.5–5.2)
Sodium: 140 mmol/L (ref 134–144)
Total Protein: 6.9 g/dL (ref 6.0–8.5)

## 2019-06-03 LAB — CBC
Hematocrit: 42.9 % (ref 34.0–46.6)
Hemoglobin: 13.8 g/dL (ref 11.1–15.9)
MCH: 29.2 pg (ref 26.6–33.0)
MCHC: 32.2 g/dL (ref 31.5–35.7)
MCV: 91 fL (ref 79–97)
Platelets: 198 10*3/uL (ref 150–450)
RBC: 4.73 x10E6/uL (ref 3.77–5.28)
RDW: 13.3 % (ref 11.7–15.4)
WBC: 4.5 10*3/uL (ref 3.4–10.8)

## 2019-06-03 LAB — LIPID PANEL
Chol/HDL Ratio: 2.7 ratio (ref 0.0–4.4)
Cholesterol, Total: 146 mg/dL (ref 100–199)
HDL: 55 mg/dL (ref >39)
LDL Chol Calc (NIH): 74 mg/dL (ref 0–99)
Triglycerides: 90 mg/dL (ref 0–149)
VLDL Cholesterol Cal: 17 mg/dL (ref 5–40)

## 2019-06-03 LAB — TSH: TSH: 3.35 u[IU]/mL (ref 0.450–4.500)

## 2019-06-09 ENCOUNTER — Other Ambulatory Visit: Payer: Self-pay | Admitting: *Deleted

## 2019-06-09 MED ORDER — CHOLECALCIFEROL 100 MCG (4000 UT) PO TABS: 1.0000 | ORAL_TABLET | Freq: Every day | ORAL | Status: DC

## 2019-06-09 NOTE — Progress Notes (Signed)
Vit d level low. Daily supplementation ordered by Dr. Harrington Challenger.

## 2019-06-27 DIAGNOSIS — I493 Ventricular premature depolarization: Secondary | ICD-10-CM | POA: Diagnosis not present

## 2019-06-27 DIAGNOSIS — I480 Paroxysmal atrial fibrillation: Secondary | ICD-10-CM | POA: Diagnosis not present

## 2019-09-02 DIAGNOSIS — I10 Essential (primary) hypertension: Secondary | ICD-10-CM | POA: Diagnosis not present

## 2019-09-02 DIAGNOSIS — N3000 Acute cystitis without hematuria: Secondary | ICD-10-CM | POA: Diagnosis not present

## 2019-09-12 DIAGNOSIS — N3000 Acute cystitis without hematuria: Secondary | ICD-10-CM | POA: Diagnosis not present

## 2019-11-20 DIAGNOSIS — M25512 Pain in left shoulder: Secondary | ICD-10-CM | POA: Diagnosis not present

## 2019-11-20 DIAGNOSIS — M25511 Pain in right shoulder: Secondary | ICD-10-CM | POA: Diagnosis not present

## 2019-12-05 DIAGNOSIS — Z1231 Encounter for screening mammogram for malignant neoplasm of breast: Secondary | ICD-10-CM | POA: Diagnosis not present

## 2019-12-17 DIAGNOSIS — M5431 Sciatica, right side: Secondary | ICD-10-CM | POA: Diagnosis not present

## 2019-12-18 DIAGNOSIS — M5416 Radiculopathy, lumbar region: Secondary | ICD-10-CM | POA: Diagnosis not present

## 2019-12-18 DIAGNOSIS — M545 Low back pain: Secondary | ICD-10-CM | POA: Diagnosis not present

## 2020-01-14 DIAGNOSIS — D225 Melanocytic nevi of trunk: Secondary | ICD-10-CM | POA: Diagnosis not present

## 2020-01-14 DIAGNOSIS — L259 Unspecified contact dermatitis, unspecified cause: Secondary | ICD-10-CM | POA: Diagnosis not present

## 2020-02-12 ENCOUNTER — Other Ambulatory Visit: Payer: Self-pay | Admitting: Internal Medicine

## 2020-02-12 NOTE — Telephone Encounter (Signed)
Age 85, weight 61kg, SCr 0.72 on 06/02/19. Pt on Eliquis 2.5mg  BID, weight within 1kg of cutoff for lower Eliquis dose last fall, historically has always been < 60kg. Will continue current Eliquis 2.5mg  BID dosing. Afib indication, last OV Sept 2020

## 2020-03-16 DIAGNOSIS — R300 Dysuria: Secondary | ICD-10-CM | POA: Diagnosis not present

## 2020-04-17 DIAGNOSIS — S61217A Laceration without foreign body of left little finger without damage to nail, initial encounter: Secondary | ICD-10-CM | POA: Diagnosis not present

## 2020-04-17 DIAGNOSIS — Z23 Encounter for immunization: Secondary | ICD-10-CM | POA: Diagnosis not present

## 2020-06-21 DIAGNOSIS — M25511 Pain in right shoulder: Secondary | ICD-10-CM | POA: Diagnosis not present

## 2020-06-25 ENCOUNTER — Ambulatory Visit: Payer: PPO | Admitting: Physician Assistant

## 2020-06-29 ENCOUNTER — Other Ambulatory Visit: Payer: Self-pay | Admitting: Internal Medicine

## 2020-07-09 ENCOUNTER — Other Ambulatory Visit: Payer: Self-pay | Admitting: Internal Medicine

## 2020-07-21 ENCOUNTER — Other Ambulatory Visit: Payer: Self-pay

## 2020-07-21 ENCOUNTER — Ambulatory Visit: Payer: PPO | Admitting: Physician Assistant

## 2020-07-21 ENCOUNTER — Encounter: Payer: Self-pay | Admitting: Physician Assistant

## 2020-07-21 VITALS — BP 118/80 | HR 72 | Ht 64.0 in | Wt 128.8 lb

## 2020-07-21 DIAGNOSIS — I6523 Occlusion and stenosis of bilateral carotid arteries: Secondary | ICD-10-CM

## 2020-07-21 DIAGNOSIS — E782 Mixed hyperlipidemia: Secondary | ICD-10-CM | POA: Diagnosis not present

## 2020-07-21 DIAGNOSIS — I480 Paroxysmal atrial fibrillation: Secondary | ICD-10-CM | POA: Diagnosis not present

## 2020-07-21 DIAGNOSIS — I10 Essential (primary) hypertension: Secondary | ICD-10-CM | POA: Diagnosis not present

## 2020-07-21 DIAGNOSIS — I493 Ventricular premature depolarization: Secondary | ICD-10-CM | POA: Diagnosis not present

## 2020-07-21 MED ORDER — APIXABAN 2.5 MG PO TABS: 2.5000 mg | ORAL_TABLET | Freq: Two times a day (BID) | ORAL | 3 refills | Status: DC

## 2020-07-21 MED ORDER — ROSUVASTATIN CALCIUM 5 MG PO TABS: 2.5000 mg | ORAL_TABLET | Freq: Every day | ORAL | 3 refills | Status: DC

## 2020-07-21 MED ORDER — ACEBUTOLOL HCL 200 MG PO CAPS: 200.0000 mg | ORAL_CAPSULE | Freq: Every day | ORAL | 3 refills | Status: DC

## 2020-07-21 MED ORDER — AMLODIPINE BESYLATE 5 MG PO TABS: 5.0000 mg | ORAL_TABLET | Freq: Every day | ORAL | 3 refills | Status: DC

## 2020-07-21 NOTE — Patient Instructions (Signed)
Medication Instructions:  Your physician recommends that you continue on your current medications as directed. Please refer to the Current Medication list given to you today.  *If you need a refill on your cardiac medications before your next appointment, please call your pharmacy*  Lab Work: Your physician recommends that you return for lab work when you have your carotid ultrasound.  Testing/Procedures: Your physician has requested that you have a carotid duplex. This test is an ultrasound of the carotid arteries in your neck. It looks at blood flow through these arteries that supply the brain with blood. Allow one hour for this exam. There are no restrictions or special instructions.  Follow-Up: At [facility], you and your health needs are our priority.  As part of our continuing mission to provide you with exceptional heart care, we have created designated Provider Care Teams.  These Care Teams include your primary Cardiologist (physician) and Advanced Practice Providers (APPs -  Physician Assistants and Nurse Practitioners) who all work together to provide you with the care you need, when you need it.  Your next appointment:   12 month(s)  The format for your next appointment:   In Person  Provider:   Dorris Carnes, MD

## 2020-07-21 NOTE — Progress Notes (Signed)
Cardiology Office Note:    Date:  07/21/2020   ID:  ROSHUNDA KEIR, DOB [birth year], MRN [id number]  PCP:  Raina Mina., MD  [facility] Cardiologist:  Dorris Carnes, MD  [facility] Electrophysiologist:  None   Referring MD: Raina Mina., MD   Chief Complaint:  Follow-up (AFib, Carotid artery disease )    Patient Profile:    NEZZIE MANERA is a 85 y.o. female with:   Paroxysmal atrial fibrillation   Hypertension   Hyperlipidemia   Renal artery stenosis  Carotid artery disease  Fibromuscular dysplasia  PVCs  Normal cath in 2007  Prior CV studies: Zio patch monitor 06/2019 SInus rhythm  53 to 154 bpm  Average 73 bpm Rare PAC, PVC. NO significant pauses.  Carotid US 10/15/2014 Bilateral ICA 40-59 Repeat 2 years  Echocardiogram 01/23/2014 Mild LVH, EF 55, GR 1 DD, trivial AI, mild MR, mild LAE, normal RVSF, PASP 23  Event monitor 12/2013 Sinus rhythm with intermittent atrial fibrillation  Carotid US (06/2013): Bilateral 40-59% ICA - f/u 1 year  Renal Art [place] (06/2013) bilateral 1-59% (c/w FMD)   History of Present Illness:    Ms. Monical was last seen by Dr. Harrington Challenger in 04/2019 via telemedicine.  She returns for follow-up.  She is here alone.  She is doing well without chest pain, shortness of breath, syncope, orthopnea or leg swelling.  She still had occasional palpitations.  She feels that she goes back into atrial fibrillation. These episodes are short lived.  She did have these while wearing the monitor last year as well.        Past Medical History:  Diagnosis Date  . Chest pain    normal coronary agniography in 2006 or 2007  . Heart murmur   . Heart palpitations    caused by PVC's  . Hx of echocardiogram 2015   Echo (12/2013):  Mild LVH, EF 55%, no RWMA, Gr 1 DD, mild MR, mild LAE, normal RVF, PASP 23 mmHg  . Hypertension   . Premature ventricular contractions   . Renal artery stenosis ([employer])   . Symptomatic PVCs    with palpitations     Current Medications: Current Meds  Medication Sig  . acetaminophen (TYLENOL) 325 MG tablet Take 650 mg by mouth every 6 (six) hours as needed for mild pain.   . Cholecalciferol 100 MCG (4000 UT) TABS Take 1 tablet by mouth daily.  Marland Kitchen conjugated estrogens (PREMARIN) vaginal cream Place 0.5 mg vaginally 3 (three) times a week.  . tetrahydrozoline 0.05 % ophthalmic solution Place 2 drops into both eyes 2 (two) times daily.  . [DISCONTINUED] acebutolol (SECTRAL) 200 MG capsule TAKE 1 CAPSULE BY MOUTH ONCE (1) DAILY  . [DISCONTINUED] amLODipine (NORVASC) 5 MG tablet Take 1 tablet (5 mg total) by mouth daily.  . [DISCONTINUED] ELIQUIS 2.5 MG TABS tablet TAKE 1 TABLET BY MOUTH TWICE (2) DAILY  . [DISCONTINUED] rosuvastatin (CRESTOR) 5 MG tablet Take 0.5 tablets (2.5 mg total) by mouth daily.     Allergies:   Patient has no known allergies.   Social History   Tobacco Use  . Smoking status: Never Smoker  . Smokeless tobacco: Never Used  Vaping Use  . Vaping Use: Never used  Substance Use Topics  . Alcohol use: No  . Drug use: No     Family Hx: The patient's family history includes Aneurysm (age of onset: 63) in her father; Esophageal cancer in her brother; Hypertension in her mother; Pulmonary  embolism in her brother; Stomach cancer in her brother; Vascular Disease in her brother.  Review of Systems  Gastrointestinal: Negative for hematochezia and melena.  Genitourinary: Negative for hematuria.     EKGs/Labs/Other Test Reviewed:    EKG:  EKG is  ordered today.  The ekg ordered today demonstrates normal sinus rhythm, HR 72, normal axis, non-specific ST-TW changes, QTc 433, no change compared to prior tracing.   Recent Labs: No results found for requested labs within last 8760 hours.   Recent Lipid Panel Lab Results  Component Value Date/Time   CHOL 146 06/02/2019 09:50 AM   TRIG 90 06/02/2019 09:50 AM   HDL 55 06/02/2019 09:50 AM   CHOLHDL 2.7 06/02/2019 09:50 AM   CHOLHDL  2.6 12/31/2015 10:18 AM   LDLCALC 74 06/02/2019 09:50 AM      Risk Assessment/Calculations:     CHA2DS2-VASc Score = 5  This indicates a 7.2% annual risk of stroke. The patient's score is based upon: CHF History: 0 HTN History: 1 Diabetes History: 0 Stroke History: 0 Vascular Disease History: 1 Age Score: 2 Gender Score: 1     Physical Exam:    VS:  BP 118/80   Pulse 72   Ht 5\' 4"  (1.626 m)   Wt 128 lb 12.8 oz (58.4 kg)   SpO2 92%   BMI 22.11 kg/m     Wt Readings from Last 3 Encounters:  07/21/20 128 lb 12.8 oz (58.4 kg)  05/26/19 135 lb (61.2 kg)  01/22/18 131 lb 12.8 oz (59.8 kg)     Constitutional:      Appearance: Healthy appearance. Not in distress.  Neck:     Vascular: JVD normal.     Lymphadenopathy: No cervical adenopathy.  Pulmonary:     Effort: Pulmonary effort is normal.     Breath sounds: No wheezing. No rales.  Cardiovascular:     Normal rate. Regular rhythm. Normal S1. Normal S2.     Murmurs: There is no murmur.  Edema:    Peripheral edema absent.  Abdominal:     Palpations: Abdomen is soft.  Skin:    General: Skin is warm and dry.  Neurological:     Mental Status: Alert and oriented to person, place and time.     Cranial Nerves: Cranial nerves are intact.       ASSESSMENT & PLAN:    1. PAF (paroxysmal atrial fibrillation) ([place]) She is maintaining normal sinus rhythm.  She does note occasional palpitations.  She felt these while she was wearing the monitor last year.  Question if she is feeling her PVCs as well.  She is tolerating anticoagulation.  Her weight is < 60 kg and age > 76.  She should remain on Apixaban 2.5 mg twice daily.  Arrange f/u CMET, CBC.   2. PVC's (premature ventricular contractions) I suspect a lot of her palpitations are related to PVCs. She did not have a large burden on her monitor last year.  Continue Acebutolol.    3. Essential hypertension The patient's blood pressure is controlled on her current regimen.   Continue current therapy.   She notes that she checks her BP prior to taking her meds and holds them if it is low. I have encouraged her to take her medicines every day and monitor her BP after taking her meds.  She can check her BP prior to taking meds if she feels bad and hold it if it is low.    4. Mixed hyperlipidemia  Continue statin Rx.  Arrange fasting CMET, Lipids.    5. Bilateral carotid artery stenosis She has not had her carotid US since 2016.  Arrange f/u Carotid US.    Dispo:  Return in about 1 year (around 07/21/2021) for Routine Follow Up, w/ Dr. Harrington Challenger, in person.   Medication Adjustments/Labs and Tests Ordered: Current medicines are reviewed at length with the patient today.  Concerns regarding medicines are outlined above.  Tests Ordered: Orders Placed This Encounter  Procedures  . CBC  . Comprehensive metabolic panel  . Lipid panel  . EKG 12-Lead  . VAS US CAROTID   Medication Changes: Meds ordered this encounter  Medications  . acebutolol (SECTRAL) 200 MG capsule    Sig: Take 1 capsule (200 mg total) by mouth daily.    Dispense:  90 capsule    Refill:  3    Order Specific Question:   Supervising Provider    Answer:   Kirk Ruths S [[id number]]  . amLODipine (NORVASC) 5 MG tablet    Sig: Take 1 tablet (5 mg total) by mouth daily.    Dispense:  90 tablet    Refill:  3    Order Specific Question:   Supervising Provider    Answer:   Lelon Perla [[id number]]  . apixaban (ELIQUIS) 2.5 MG TABS tablet    Sig: Take 1 tablet (2.5 mg total) by mouth 2 (two) times daily.    Dispense:  180 tablet    Refill:  3    Order Specific Question:   Supervising Provider    Answer:   Lelon Perla [[id number]]  . rosuvastatin (CRESTOR) 5 MG tablet    Sig: Take 0.5 tablets (2.5 mg total) by mouth daily.    Dispense:  45 tablet    Refill:  3    Order Specific Question:   Supervising Provider    Answer:   Lelon Perla [[id number]]    Signed, Richardson Dopp, PA-C  07/21/2020 4:13  PM    [facility] [place], [place], [place]  [address] Phone: [phone]; Fax: [phone]

## 2020-08-03 ENCOUNTER — Ambulatory Visit (HOSPITAL_COMMUNITY): Payer: PPO

## 2020-08-09 DIAGNOSIS — Z23 Encounter for immunization: Secondary | ICD-10-CM | POA: Diagnosis not present

## 2020-08-09 DIAGNOSIS — M4802 Spinal stenosis, cervical region: Secondary | ICD-10-CM | POA: Diagnosis not present

## 2020-08-09 DIAGNOSIS — I773 Arterial fibromuscular dysplasia: Secondary | ICD-10-CM | POA: Diagnosis not present

## 2020-08-09 DIAGNOSIS — I493 Ventricular premature depolarization: Secondary | ICD-10-CM | POA: Diagnosis not present

## 2020-08-09 DIAGNOSIS — E782 Mixed hyperlipidemia: Secondary | ICD-10-CM | POA: Diagnosis not present

## 2020-08-09 DIAGNOSIS — I701 Atherosclerosis of renal artery: Secondary | ICD-10-CM | POA: Diagnosis not present

## 2020-08-09 DIAGNOSIS — N6011 Diffuse cystic mastopathy of right breast: Secondary | ICD-10-CM | POA: Diagnosis not present

## 2020-08-09 DIAGNOSIS — M8580 Other specified disorders of bone density and structure, unspecified site: Secondary | ICD-10-CM | POA: Diagnosis not present

## 2020-08-09 DIAGNOSIS — I10 Essential (primary) hypertension: Secondary | ICD-10-CM | POA: Diagnosis not present

## 2020-08-09 DIAGNOSIS — E049 Nontoxic goiter, unspecified: Secondary | ICD-10-CM | POA: Diagnosis not present

## 2020-08-09 DIAGNOSIS — E042 Nontoxic multinodular goiter: Secondary | ICD-10-CM | POA: Diagnosis not present

## 2020-08-09 DIAGNOSIS — I480 Paroxysmal atrial fibrillation: Secondary | ICD-10-CM | POA: Diagnosis not present

## 2020-08-09 DIAGNOSIS — R739 Hyperglycemia, unspecified: Secondary | ICD-10-CM | POA: Diagnosis not present

## 2020-08-09 DIAGNOSIS — Z79899 Other long term (current) drug therapy: Secondary | ICD-10-CM | POA: Diagnosis not present

## 2020-08-09 DIAGNOSIS — R8290 Unspecified abnormal findings in urine: Secondary | ICD-10-CM | POA: Diagnosis not present

## 2020-08-09 DIAGNOSIS — I6523 Occlusion and stenosis of bilateral carotid arteries: Secondary | ICD-10-CM | POA: Diagnosis not present

## 2020-08-09 DIAGNOSIS — Z0000 Encounter for general adult medical examination without abnormal findings: Secondary | ICD-10-CM | POA: Diagnosis not present

## 2020-08-22 DIAGNOSIS — Z20822 Contact with and (suspected) exposure to covid-19: Secondary | ICD-10-CM | POA: Diagnosis not present

## 2020-08-22 DIAGNOSIS — J029 Acute pharyngitis, unspecified: Secondary | ICD-10-CM | POA: Diagnosis not present

## 2020-08-31 ENCOUNTER — Ambulatory Visit (HOSPITAL_COMMUNITY)
Admission: RE | Admit: 2020-08-31 | Payer: PPO | Source: Ambulatory Visit | Attending: Physician Assistant | Admitting: Physician Assistant

## 2020-09-03 ENCOUNTER — Encounter (HOSPITAL_COMMUNITY): Payer: PPO

## 2020-09-10 ENCOUNTER — Ambulatory Visit (HOSPITAL_COMMUNITY)
Admission: RE | Admit: 2020-09-10 | Payer: PPO | Source: Ambulatory Visit | Attending: Physician Assistant | Admitting: Physician Assistant

## 2020-10-12 ENCOUNTER — Other Ambulatory Visit (HOSPITAL_COMMUNITY): Payer: Self-pay | Admitting: Physician Assistant

## 2020-10-12 DIAGNOSIS — I773 Arterial fibromuscular dysplasia: Secondary | ICD-10-CM

## 2020-10-14 ENCOUNTER — Inpatient Hospital Stay (HOSPITAL_COMMUNITY): Admission: RE | Admit: 2020-10-14 | Payer: PPO | Source: Ambulatory Visit

## 2021-02-17 DIAGNOSIS — N6019 Diffuse cystic mastopathy of unspecified breast: Secondary | ICD-10-CM | POA: Diagnosis not present

## 2021-02-17 DIAGNOSIS — I6523 Occlusion and stenosis of bilateral carotid arteries: Secondary | ICD-10-CM | POA: Diagnosis not present

## 2021-02-17 DIAGNOSIS — M159 Polyosteoarthritis, unspecified: Secondary | ICD-10-CM | POA: Diagnosis not present

## 2021-02-17 DIAGNOSIS — Z0000 Encounter for general adult medical examination without abnormal findings: Secondary | ICD-10-CM | POA: Diagnosis not present

## 2021-02-17 DIAGNOSIS — I10 Essential (primary) hypertension: Secondary | ICD-10-CM | POA: Diagnosis not present

## 2021-02-17 DIAGNOSIS — I701 Atherosclerosis of renal artery: Secondary | ICD-10-CM | POA: Diagnosis not present

## 2021-02-17 DIAGNOSIS — I493 Ventricular premature depolarization: Secondary | ICD-10-CM | POA: Diagnosis not present

## 2021-02-17 DIAGNOSIS — M8589 Other specified disorders of bone density and structure, multiple sites: Secondary | ICD-10-CM | POA: Diagnosis not present

## 2021-02-17 DIAGNOSIS — Z0001 Encounter for general adult medical examination with abnormal findings: Secondary | ICD-10-CM | POA: Diagnosis not present

## 2021-02-17 DIAGNOSIS — E782 Mixed hyperlipidemia: Secondary | ICD-10-CM | POA: Diagnosis not present

## 2021-02-17 DIAGNOSIS — I480 Paroxysmal atrial fibrillation: Secondary | ICD-10-CM | POA: Diagnosis not present

## 2021-02-17 DIAGNOSIS — E785 Hyperlipidemia, unspecified: Secondary | ICD-10-CM | POA: Diagnosis not present

## 2021-02-17 DIAGNOSIS — I773 Arterial fibromuscular dysplasia: Secondary | ICD-10-CM | POA: Diagnosis not present

## 2021-02-17 DIAGNOSIS — Z23 Encounter for immunization: Secondary | ICD-10-CM | POA: Diagnosis not present

## 2021-02-17 DIAGNOSIS — Z79899 Other long term (current) drug therapy: Secondary | ICD-10-CM | POA: Diagnosis not present

## 2021-02-17 DIAGNOSIS — R739 Hyperglycemia, unspecified: Secondary | ICD-10-CM | POA: Diagnosis not present

## 2021-02-17 DIAGNOSIS — M4802 Spinal stenosis, cervical region: Secondary | ICD-10-CM | POA: Diagnosis not present

## 2021-02-17 DIAGNOSIS — M25552 Pain in left hip: Secondary | ICD-10-CM | POA: Diagnosis not present

## 2021-03-09 DIAGNOSIS — R300 Dysuria: Secondary | ICD-10-CM | POA: Diagnosis not present

## 2021-03-23 DIAGNOSIS — M8589 Other specified disorders of bone density and structure, multiple sites: Secondary | ICD-10-CM | POA: Diagnosis not present

## 2021-03-23 DIAGNOSIS — E7800 Pure hypercholesterolemia, unspecified: Secondary | ICD-10-CM | POA: Diagnosis not present

## 2021-03-23 DIAGNOSIS — M810 Age-related osteoporosis without current pathological fracture: Secondary | ICD-10-CM | POA: Diagnosis not present

## 2021-03-23 DIAGNOSIS — I6523 Occlusion and stenosis of bilateral carotid arteries: Secondary | ICD-10-CM | POA: Diagnosis not present

## 2021-03-23 DIAGNOSIS — R0989 Other specified symptoms and signs involving the circulatory and respiratory systems: Secondary | ICD-10-CM | POA: Diagnosis not present

## 2021-03-23 DIAGNOSIS — I10 Essential (primary) hypertension: Secondary | ICD-10-CM | POA: Diagnosis not present

## 2021-03-23 DIAGNOSIS — Z1231 Encounter for screening mammogram for malignant neoplasm of breast: Secondary | ICD-10-CM | POA: Diagnosis not present

## 2021-03-29 DIAGNOSIS — R350 Frequency of micturition: Secondary | ICD-10-CM | POA: Diagnosis not present

## 2021-04-04 DIAGNOSIS — M810 Age-related osteoporosis without current pathological fracture: Secondary | ICD-10-CM | POA: Diagnosis not present

## 2021-04-04 DIAGNOSIS — I480 Paroxysmal atrial fibrillation: Secondary | ICD-10-CM | POA: Diagnosis not present

## 2021-04-04 DIAGNOSIS — M159 Polyosteoarthritis, unspecified: Secondary | ICD-10-CM | POA: Diagnosis not present

## 2021-09-18 NOTE — Progress Notes (Signed)
Cardiology Office Note   Date:  09/19/2021   ID:  Stefanie Ferguson, DOB [birth year], MRN [id number]  PCP:  Raina Mina., MD  Cardiologist:   Dorris Carnes, MD   PT presents for f/u of PAF and HTN     History of Present Illness: Stefanie Ferguson is a 87 y.o. female with a history of PAF(CHA2DS2-VASc score o 5) , HTN, HL, RAS, CV dz, FMD, PVCs  Normal cath in 2007    She was last in cardiology clinic in Nov 2021 when she was seen by Kathleen Argue     Patients breathing is great   No significant skips   No CP  She notes occasional  GERD         Current Meds  Medication Sig   acetaminophen (TYLENOL) 325 MG tablet Take 650 mg by mouth every 6 (six) hours as needed for mild pain.    ALPRAZolam (XANAX) 0.25 MG tablet Take 1 tablet by mouth as needed.   Cholecalciferol 100 MCG (4000 UT) TABS Take 1 tablet by mouth daily.   conjugated estrogens (PREMARIN) vaginal cream Place 0.5 mg vaginally 3 (three) times a week.   Cyanocobalamin (B-12) 3000 MCG CAPS Take 1 capsule by mouth daily.   LISINOPRIL PO Take 100 mg by mouth daily.   Omega-3 Fatty Acids (FISH OIL) 1000 MG CAPS Take 1 capsule by mouth daily.   OVER THE COUNTER MEDICATION Place 1 drop into both eyes daily. Similason pt. Instructed to put 1 drop in both eyes daily.   tetrahydrozoline 0.05 % ophthalmic solution Place 2 drops into both eyes 2 (two) times daily.     Allergies:   Bupivacaine and Dexamethasone   Past Medical History:  Diagnosis Date   Chest pain    normal coronary agniography in 2006 or 2007   Heart murmur    Heart palpitations    caused by PVC's   Hx of echocardiogram 2015   Echo (12/2013):  Mild LVH, EF 55%, no RWMA, Gr 1 DD, mild MR, mild LAE, normal RVF, PASP 23 mmHg   Hypertension    Premature ventricular contractions    Renal artery stenosis ([facility])    Symptomatic PVCs    with palpitations    Past Surgical History:  Procedure Laterality Date   BREAST SURGERY  2011   excision papilloma left breast    CARDIAC CATHETERIZATION     CHOLECYSTECTOMY OPEN  1988   CYSTOCELE REPAIR  2009   and Rectocele   ROTATOR CUFF REPAIR  1996/2000   bilateral   TUBAL LIGATION  1988   VAGINAL HYSTERECTOMY  2009     Social History:  The patient  reports that she has never smoked. She has never used smokeless tobacco. She reports that she does not drink alcohol and does not use drugs.   Family History:  The patient's family history includes Aneurysm (age of onset: 13) in her father; Esophageal cancer in her brother; Hypertension in her mother; Pulmonary embolism in her brother; Stomach cancer in her brother; Vascular Disease in her brother.    ROS:  Please see the history of present illness. All other systems are reviewed and  Negative to the above problem except as noted.    PHYSICAL EXAM: VS:  BP 136/70    Pulse 63    Ht 5\' 4"  (1.626 m)    Wt 128 lb 12.8 oz (58.4 kg)    SpO2 96%    BMI 22.11 kg/m  GEN:   Thin 87 yo in no acute distress  HEENT: normal  Neck: no JVD, carotid bruits Cardiac: RRR; no murmur  No Le edema  Respiratory:  clear to auscultation bilaterally GI: soft, nontender, nondistended, + BS  No hepatomegaly  MS: no deformity Moving all extremities   Skin: warm and dry, no rash Neuro:  Strength and sensation are intact Psych: euthymic mood, full affect   EKG:  EKG is ordered today.NSR   63 bpm     Tests  Prior CV studies: Zio patch monitor 06/2019 SInus rhythm  53 to 154 bpm  Average 73 bpm Rare PAC, PVC. NO significant pauses.   Carotid US 10/15/2014 Bilateral ICA 40-59 Repeat 2 years   Echocardiogram 01/23/2014 Mild LVH, EF 55, GR 1 DD, trivial AI, mild MR, mild LAE, normal RVSF, PASP 23   Event monitor 12/2013 Sinus rhythm with intermittent atrial fibrillation   Carotid US (06/2013): Bilateral 40-59% ICA - f/u 1 year   Renal Art [place] (06/2013) bilateral 1-59% (c/w FMD) Lipid Panel    Component Value Date/Time   CHOL 146 06/02/2019 0950   TRIG 90 06/02/2019 0950    HDL 55 06/02/2019 0950   CHOLHDL 2.7 06/02/2019 0950   CHOLHDL 2.6 12/31/2015 1018   VLDL 21 12/31/2015 1018   LDLCALC 74 06/02/2019 0950      Wt Readings from Last 3 Encounters:  09/19/21 128 lb 12.8 oz (58.4 kg)  07/21/20 128 lb 12.8 oz (58.4 kg)  05/26/19 135 lb (61.2 kg)      ASSESSMENT AND PLAN:  1. PAF (paroxysmal atrial fibrillation) ([facility]CC)  CHADSVASc is 5    Pt remains in SR   Keep on Eliquis       Will get labs from Dr [facility] [employer] office    2. PVC's (premature ventricular contractions)\ Pt denies palpitations   Follow      3. Essential hypertension BP is OK for age   Keep on current regimen   4. Mixed hyperlipidemia Continue statin Rx.  Review labs       5. Bilateral carotid artery stenosis She has not had her carotid US since 2016.  Arrange f/u Carotid US.    Plan for f/u in 1 year   Sooner for problems     Current medicines are reviewed at length with the patient today.  The patient does not have concerns regarding medicines.  Signed, Dorris Carnes, MD  09/19/2021 10:10 AM    [facility] [place], [place], [place]  [address] Phone: [phone]; Fax: [phone]

## 2021-09-19 ENCOUNTER — Other Ambulatory Visit: Payer: Self-pay

## 2021-09-19 ENCOUNTER — Encounter: Payer: Self-pay | Admitting: Internal Medicine

## 2021-09-19 ENCOUNTER — Ambulatory Visit: Payer: PPO | Admitting: Internal Medicine

## 2021-09-19 VITALS — BP 136/70 | HR 63 | Ht 64.0 in | Wt 128.8 lb

## 2021-09-19 DIAGNOSIS — I480 Paroxysmal atrial fibrillation: Secondary | ICD-10-CM | POA: Diagnosis not present

## 2021-09-19 DIAGNOSIS — Z79899 Other long term (current) drug therapy: Secondary | ICD-10-CM

## 2021-09-19 DIAGNOSIS — E782 Mixed hyperlipidemia: Secondary | ICD-10-CM | POA: Diagnosis not present

## 2021-09-19 DIAGNOSIS — I493 Ventricular premature depolarization: Secondary | ICD-10-CM | POA: Diagnosis not present

## 2021-09-19 MED ORDER — LISINOPRIL 10 MG PO TABS: 10.0000 mg | ORAL_TABLET | Freq: Every day | ORAL | 3 refills | Status: DC

## 2021-09-19 MED ORDER — CYCLOBENZAPRINE HCL 5 MG PO TABS: ORAL_TABLET | ORAL | 0 refills | Status: DC

## 2021-09-19 MED ORDER — ROSUVASTATIN CALCIUM 5 MG PO TABS: 5.0000 mg | ORAL_TABLET | Freq: Every day | ORAL | 3 refills | Status: DC

## 2021-09-19 NOTE — Patient Instructions (Addendum)
Medication Instructions:  Increase your Crestor/Rosuvastatin to 5 mg daily Flexeril as needed for back spasms   *If you need a refill on your cardiac medications before your next appointment, please call your pharmacy*   Labs  Lipid and hepatic fasting in 8 weeks  If you have labs (blood work) drawn today and your tests are completely normal, you will receive your results only by: [place] (if you have MyChart) OR A paper copy in the mail If you have any lab test that is abnormal or we need to change your treatment, we will call you to review the results.   Testing/Procedures: Your physician has requested that you have a carotid duplex. This test is an ultrasound of the carotid arteries in your neck. It looks at blood flow through these arteries that supply the brain with blood. Allow one hour for this exam. There are no restrictions or special instructions.    Follow-Up: At [facility], you and your health needs are our priority.  As part of our continuing mission to provide you with exceptional heart care, we have created designated Provider Care Teams.  These Care Teams include your primary Cardiologist (physician) and Advanced Practice Providers (APPs -  Physician Assistants and Nurse Practitioners) who all work together to provide you with the care you need, when you need it.  We recommend signing up for the patient portal called "MyChart".  Sign up information is provided on this After Visit Summary.  MyChart is used to connect with patients for Virtual Visits (Telemedicine).  Patients are able to view lab/test results, encounter notes, upcoming appointments, etc.  Non-urgent messages can be sent to your provider as well.   To learn more about what you can do with MyChart, go to [email or web address].    Your next appointment:   1 year(s)  The format for your next appointment:   In Person  Provider:   Dorris Carnes, MD     Other Instructions

## 2021-09-27 ENCOUNTER — Other Ambulatory Visit: Payer: Self-pay

## 2021-09-27 ENCOUNTER — Other Ambulatory Visit: Payer: Self-pay | Admitting: Internal Medicine

## 2021-09-27 ENCOUNTER — Ambulatory Visit (HOSPITAL_COMMUNITY)
Admission: RE | Admit: 2021-09-27 | Discharge: 2021-09-27 | Disposition: A | Discharge status: Home / Self Care | Payer: PPO | Source: Ambulatory Visit | Attending: Cardiovascular Disease | Admitting: Cardiovascular Disease

## 2021-09-27 DIAGNOSIS — I480 Paroxysmal atrial fibrillation: Secondary | ICD-10-CM | POA: Insufficient documentation

## 2021-09-27 DIAGNOSIS — I6523 Occlusion and stenosis of bilateral carotid arteries: Secondary | ICD-10-CM | POA: Insufficient documentation

## 2021-09-27 DIAGNOSIS — E782 Mixed hyperlipidemia: Secondary | ICD-10-CM | POA: Insufficient documentation

## 2021-09-27 DIAGNOSIS — I493 Ventricular premature depolarization: Secondary | ICD-10-CM

## 2021-09-27 DIAGNOSIS — Z79899 Other long term (current) drug therapy: Secondary | ICD-10-CM | POA: Insufficient documentation

## 2021-09-29 ENCOUNTER — Other Ambulatory Visit: Payer: Self-pay | Admitting: Physician Assistant

## 2021-09-29 NOTE — Telephone Encounter (Signed)
Eliquis 2.5mg  refill request received. Patient is 87 years old, weight-58.4kg, Crea-0.61 on 08/30/2021 via [place], Diagnosis-Afib, and last seen by Dr. Harrington Challenger on 09/19/2021. Dose is appropriate based on dosing criteria. Will send in refill to requested pharmacy.

## 2021-09-29 NOTE — Telephone Encounter (Signed)
Will route to [place] for Eliquis refill

## 2021-10-03 ENCOUNTER — Other Ambulatory Visit: Payer: Self-pay | Admitting: Physician Assistant

## 2021-10-13 ENCOUNTER — Telehealth: Payer: Self-pay

## 2021-10-13 NOTE — Telephone Encounter (Signed)
Pt advised her labs from [facility] results and says she just started the Crestor 5 mg 09/19/21 and she has planned repeat labs 11/17/21... she asks that she keep the current dose since it is fairly new and see what her next set of labs show prior to increasing the dose.  Pt will return for her labs fasting.

## 2021-10-13 NOTE — Telephone Encounter (Signed)
-----   Message from Dorris Carnes V, MD sent at 10/06/2021  1:07 AM EST ----- Labs from outside LDL is 99  It should be below, toward 70 Is she on statin?    I would recomm Crestor 20 with f/u lipids and AST in 8 wks

## 2021-11-15 ENCOUNTER — Other Ambulatory Visit: Payer: PPO

## 2021-11-17 ENCOUNTER — Other Ambulatory Visit: Payer: PPO

## 2021-11-24 ENCOUNTER — Other Ambulatory Visit: Payer: PPO

## 2021-11-24 LAB — LIPID PANEL
Chol/HDL Ratio: 2.3 ratio (ref 0.0–4.4)
Cholesterol, Total: 138 mg/dL (ref 100–199)
HDL: 60 mg/dL (ref >39)
LDL Chol Calc (NIH): 66 mg/dL (ref 0–99)
Triglycerides: 57 mg/dL (ref 0–149)
VLDL Cholesterol Cal: 12 mg/dL (ref 5–40)

## 2021-11-24 LAB — HEPATIC FUNCTION PANEL
ALT: 15 IU/L (ref 0–32)
AST: 22 IU/L (ref 0–40)
Albumin: 4.3 g/dL (ref 3.6–4.6)
Alkaline Phosphatase: 79 IU/L (ref 44–121)
Bilirubin Total: 1.3 mg/dL — ABNORMAL HIGH (ref 0.0–1.2)
Bilirubin, Direct: 0.3 mg/dL (ref 0.00–0.40)
Total Protein: 6.4 g/dL (ref 6.0–8.5)

## 2021-12-30 ENCOUNTER — Other Ambulatory Visit: Payer: Self-pay | Admitting: Physician Assistant

## 2022-02-21 ENCOUNTER — Ambulatory Visit: Payer: PPO | Admitting: Internal Medicine

## 2022-02-21 ENCOUNTER — Encounter: Payer: Self-pay | Admitting: Internal Medicine

## 2022-02-21 VITALS — BP 120/70 | HR 70 | Ht 63.0 in | Wt 126.2 lb

## 2022-02-21 DIAGNOSIS — I480 Paroxysmal atrial fibrillation: Secondary | ICD-10-CM

## 2022-02-21 MED ORDER — AMLODIPINE BESYLATE 2.5 MG PO TABS: ORAL_TABLET | ORAL | 6 refills | Status: DC

## 2022-03-09 ENCOUNTER — Telehealth: Payer: Self-pay | Admitting: Internal Medicine

## 2022-03-09 NOTE — Telephone Encounter (Signed)
   Pre-operative Risk Assessment    Patient Name: Stefanie Ferguson  DOB: Jan 25, 1935 MRN: [id number]     Request for Surgical Clearance    Procedure:  Crown  Date of Surgery:  Clearance TBD                                 Surgeon:    Dr. Romero Liner Surgeon's Group or Practice Name:  [facility] [place] Phone number:   [phone] Fax number:     Type of Clearance Requested:   - Medical    Type of Anesthesia:    Local   Additional requests/questions:    Patient called concerned about injection being administered at time of procedure. Patient stated she will have a consult on Monday, 7/17.   Signed, Heloise Beecham   03/09/2022, 1:39 PM

## 2022-03-10 NOTE — Telephone Encounter (Signed)
   Patient Name: Stefanie Ferguson  DOB: 08/09/1935 MRN: [id number]  Primary Cardiologist: Dorris Carnes, MD  Chart reviewed as part of pre-operative protocol coverage. Pre-op clearance already addressed by colleagues in earlier phone notes. To summarize recommendations:  -okay to proceed with crown surgery. -Dr. Harrington Challenger  Will route this bundled recommendation to requesting provider via Epic fax function and remove from pre-op pool. Please call with questions.  Elgie Collard, PA-C 03/10/2022, 5:26 PM

## 2022-03-10 NOTE — Telephone Encounter (Signed)
Pleae forward clearance to Dr Romero Liner

## 2022-03-10 NOTE — Telephone Encounter (Signed)
I saw pt recently in clinic   DOing good at the time OK to proceed with crown surgery on 7/17

## 2022-03-13 NOTE — Telephone Encounter (Signed)
I tried the [phone] # that is on the clearance notes here. Not sure if this was entered in error as the number is continuously busy signal.  I did a good search and found ph# for Dr. Romero Liner, [place]. I called and did confirm that I had the correct office for the pt.  I was able to confirm a fax # of [phone]. I will fax clearance notes today.

## 2022-03-15 NOTE — Telephone Encounter (Signed)
This has been completed and notes have been faxed.

## 2022-03-20 ENCOUNTER — Encounter: Payer: Self-pay | Admitting: Internal Medicine

## 2022-08-02 ENCOUNTER — Encounter: Payer: Self-pay | Admitting: Internal Medicine

## 2022-08-04 NOTE — Telephone Encounter (Signed)
Pt has intermitt atrial fibrillaiton , therefore has increased risk of stroke if not on anticoagulation

## 2022-08-09 ENCOUNTER — Ambulatory Visit: Payer: PPO | Admitting: Physician Assistant

## 2022-08-10 NOTE — Progress Notes (Signed)
Office Visit    Patient Name: Stefanie Ferguson Date of Encounter: 08/11/2022  PCP:  Raina Mina., MD   [facility]  Cardiologist:  Dorris Carnes, MD  Advanced Practice Provider:  No care team member to display Electrophysiologist:  None   HPI    Stefanie Ferguson is a 87 y.o. female with a past medical history significant for PAF (CHA2DS2-VASc score of 5), hypertension, hyperlipidemia, RAS, CV disease, FMD, PVCs, and normal cardiac cath 2007 presents today for follow-up appointment.  She was seen by Dr. Harrington Challenger January 2023.  BP was noted to be running a little low and the patient felt dizzy at times.  Cut lisinopril down to 5 mg and eventually stop lisinopril.  She was taking amlodipine for SBP over 140s.  Most of her blood pressures were 120s to 140s when she was last seen by Dr. Harrington Challenger 02/21/2022.  She denied chest pain, shortness of breath, and dizziness.  No palpitations.  Today, she feels like she has been overall doing okay.  She takes her Norvasc about 3 days a week when her blood pressure is greater than 258 systolic.  She stopped her Crestor because she felt like her numbers were doing well and she did not want to take it anymore.  She remains on fish oil.  She is asking about taking ibuprofen with the other blood thinner that she is on (Eliquis) I suggested Tylenol instead however, if she absolutely has to take it every once in a while she can.  She understands that her bleeding risk would be higher during these times.  We discussed repeating a lipid panel in March since she has been off her Crestor.  She has had 3 nosebleeds in the last week or so but it stopped right away when she put a clamp on it.  She also shares that she has had neck pain and headaches that are pretty constant.  She seen a neurosurgeon and had a brain scan.  She is currently being worked up for this.  Asymptomatic with her atrial fibrillation.  Reports no shortness of breath nor dyspnea on  exertion. Reports no chest pain, pressure, or tightness. No edema, orthopnea, PND. Reports no palpitations.   Past Medical History    Past Medical History:  Diagnosis Date   Chest pain    normal coronary agniography in 2006 or 2007   Heart murmur    Heart palpitations    caused by PVC's   Hx of echocardiogram 2015   Echo (12/2013):  Mild LVH, EF 55%, no RWMA, Gr 1 DD, mild MR, mild LAE, normal RVF, PASP 23 mmHg   Hypertension    Premature ventricular contractions    Renal artery stenosis (HCC)    Symptomatic PVCs    with palpitations   Past Surgical History:  Procedure Laterality Date   BREAST SURGERY  2011   excision papilloma left breast   CARDIAC CATHETERIZATION     CHOLECYSTECTOMY OPEN  1988   CYSTOCELE REPAIR  2009   and Rectocele   ROTATOR CUFF REPAIR  1996/2000   bilateral   TUBAL LIGATION  1988   VAGINAL HYSTERECTOMY  2009    Allergies  Allergies  Allergen Reactions   Bupivacaine     Other reaction(s): Other (See Comments) Inability to speak clearly and just felt "off" Inability to speak clearly and just felt "off"    Dexamethasone     Other reaction(s): Other (See Comments)  EKGs/Labs/Other Studies Reviewed:   The following studies were reviewed today: Zio patch monitor 06/2019 SInus rhythm  53 to 154 bpm  Average 73 bpm Rare PAC, PVC. NO significant pauses.   Carotid US 10/15/2014 Bilateral ICA 40-59 Repeat 2 years   Echocardiogram 01/23/2014 Mild LVH, EF 55, GR 1 DD, trivial AI, mild MR, mild LAE, normal RVSF, PASP 23   Event monitor 12/2013 Sinus rhythm with intermittent atrial fibrillation   Carotid US (06/2013): Bilateral 40-59% ICA - f/u 1 year   Renal Art [place] (06/2013) bilateral 1-59% (c/w FMD)  EKG:  EKG is not ordered today.    Recent Labs: 11/24/2021: ALT 15  Recent Lipid Panel    Component Value Date/Time   CHOL 138 11/24/2021 1034   TRIG 57 11/24/2021 1034   HDL 60 11/24/2021 1034   CHOLHDL 2.3 11/24/2021 1034   CHOLHDL  2.6 12/31/2015 1018   VLDL 21 12/31/2015 1018   LDLCALC 66 11/24/2021 1034    Risk Assessment/Calculations:   CHA2DS2-VASc Score = 5   This indicates a 7.2% annual risk of stroke. The patient's score is based upon: CHF History: 0 HTN History: 1 Diabetes History: 0 Stroke History: 0 Vascular Disease History: 1 Age Score: 2 Gender Score: 1     Home Medications   Current Meds  Medication Sig   acebutolol (SECTRAL) 200 MG capsule TAKE 1 CAPSULE BY MOUTH ONCE DAILY   acetaminophen (TYLENOL) 325 MG tablet Take 650 mg by mouth every 6 (six) hours as needed for mild pain.    ALPRAZolam (XANAX) 0.25 MG tablet Take 1 tablet by mouth as needed. Per patient taking 1/2 tablet as needed   amLODipine (NORVASC) 2.5 MG tablet TAKE 1 TAB AS NEEDED FOR SYSTOLIC (TOP NUMBER) GREATER THAN 160   Cholecalciferol 100 MCG (4000 UT) TABS Take 1 tablet by mouth daily.   conjugated estrogens (PREMARIN) vaginal cream Place 0.5 mg vaginally 3 (three) times a week.   Cyanocobalamin (B-12) 3000 MCG CAPS Take 1 capsule by mouth daily. Per patient taking 2,000 mcg   cyclobenzaprine (FLEXERIL) 5 MG tablet Take one tablet as needed for back spasms.   ELIQUIS 2.5 MG TABS tablet TAKE ONE TABLET BY MOUTH TWICE DAILY   Omega-3 Fatty Acids (FISH OIL) 1000 MG CAPS Take 1 capsule by mouth daily.   OVER THE COUNTER MEDICATION Place 1 drop into both eyes daily. Similason pt. Instructed to put 1 drop in both eyes daily.   tiZANidine (ZANAFLEX) 2 MG tablet Take by mouth as needed. Taking 1-2 tablets as needed   vitamin B-12 (CYANOCOBALAMIN) 1000 MCG tablet Take by mouth.   [DISCONTINUED] tetrahydrozoline 0.05 % ophthalmic solution Place 2 drops into both eyes 2 (two) times daily.     Review of Systems      All other systems reviewed and are otherwise negative except as noted above.  Physical Exam    VS:  BP (!) 144/80   Pulse 73   Ht '5\' 4"'$  (1.626 m)   Wt 124 lb 6.4 oz (56.4 kg)   SpO2 96%   BMI 21.35 kg/m  ,  BMI Body mass index is 21.35 kg/m.  Wt Readings from Last 3 Encounters:  08/11/22 124 lb 6.4 oz (56.4 kg)  02/21/22 126 lb 3.2 oz (57.2 kg)  09/19/21 128 lb 12.8 oz (58.4 kg)     GEN: Well nourished, well developed, in no acute distress. HEENT: normal. Neck: Supple, no JVD, carotid bruits, or masses. Cardiac: irregular irregular, no murmurs,  rubs, or gallops. No clubbing, cyanosis, edema.  Radials/PT 2+ and equal bilaterally.  Respiratory:  Respirations regular and unlabored, clear to auscultation bilaterally. GI: Soft, nontender, nondistended. MS: No deformity or atrophy. Skin: Warm and dry, no rash. Neuro:  Strength and sensation are intact. Psych: Normal affect.  Assessment & Plan    Hypertension -Well-controlled today in the clinic 144/80 -Continue current medication regimen -Continue to track blood pressure at home -Would not be overly aggressive in an 87 year old  PAF with CHA2DS2-VASc score of 5 -She remains on Eliquis 2.5 mg twice daily -She did have a few nosebleeds but controlled the bleeding quickly -She is rate controlled in the 70s today  PVCs -Occasional extra beats but not bothersome -No periods of fast heart rate that she is aware of  Mixed hyperlipidemia -Lipid panel ordered -took herself off her crestor -continue fish oil  Bilateral carotid artery stenosis -[place] in January      Disposition: Follow up 3 months with Dorris Carnes, MD or APP.  Signed, Elgie Collard, PA-C 08/11/2022, 6:01 PM [place]

## 2022-08-11 ENCOUNTER — Ambulatory Visit: Payer: PPO | Attending: Physician Assistant | Admitting: Physician Assistant

## 2022-08-11 ENCOUNTER — Encounter: Payer: Self-pay | Admitting: Physician Assistant

## 2022-08-11 VITALS — BP 144/80 | HR 73 | Ht 64.0 in | Wt 124.4 lb

## 2022-08-11 DIAGNOSIS — E782 Mixed hyperlipidemia: Secondary | ICD-10-CM

## 2022-08-11 DIAGNOSIS — I480 Paroxysmal atrial fibrillation: Secondary | ICD-10-CM

## 2022-08-11 DIAGNOSIS — Z79899 Other long term (current) drug therapy: Secondary | ICD-10-CM

## 2022-08-11 DIAGNOSIS — I493 Ventricular premature depolarization: Secondary | ICD-10-CM | POA: Diagnosis not present

## 2022-08-11 DIAGNOSIS — I10 Essential (primary) hypertension: Secondary | ICD-10-CM | POA: Diagnosis not present

## 2022-08-11 DIAGNOSIS — I6523 Occlusion and stenosis of bilateral carotid arteries: Secondary | ICD-10-CM

## 2022-08-11 NOTE — Patient Instructions (Signed)
Medication Instructions:  Your physician recommends that you continue on your current medications as directed. Please refer to the Current Medication list given to you today.  *If you need a refill on your cardiac medications before your next appointment, please call your pharmacy*   Lab Work: Fasting lipids in 2 months If you have labs (blood work) drawn today and your tests are completely normal, you will receive your results only by: [place] (if you have MyChart) OR A paper copy in the mail If you have any lab test that is abnormal or we need to change your treatment, we will call you to review the results.   Testing/Procedures: Your physician has requested that you have a carotid duplex in 1-2 months. This test is an ultrasound of the carotid arteries in your neck. It looks at blood flow through these arteries that supply the brain with blood. Allow one hour for this exam. There are no restrictions or special instructions.    Follow-Up: At [facility], you and your health needs are our priority.  As part of our continuing mission to provide you with exceptional heart care, we have created designated Provider Care Teams.  These Care Teams include your primary Cardiologist (physician) and Advanced Practice Providers (APPs -  Physician Assistants and Nurse Practitioners) who all work together to provide you with the care you need, when you need it.  Your next appointment:   3 month(s)  The format for your next appointment:   In Person  Provider:   Dorris Carnes, MD    Important Information About Sugar

## 2022-08-14 ENCOUNTER — Encounter: Payer: Self-pay | Admitting: Internal Medicine

## 2022-08-29 ENCOUNTER — Other Ambulatory Visit: Payer: Self-pay

## 2022-08-29 MED ORDER — ACEBUTOLOL HCL 200 MG PO CAPS: 200.0000 mg | ORAL_CAPSULE | Freq: Every day | ORAL | 3 refills | Status: DC

## 2022-09-25 ENCOUNTER — Other Ambulatory Visit: Payer: Self-pay | Admitting: *Deleted

## 2022-09-25 MED ORDER — AMLODIPINE BESYLATE 2.5 MG PO TABS: ORAL_TABLET | ORAL | 6 refills | Status: DC

## 2022-10-10 ENCOUNTER — Ambulatory Visit (HOSPITAL_COMMUNITY): Admission: RE | Admit: 2022-10-10 | Payer: PPO | Source: Ambulatory Visit

## 2022-10-21 ENCOUNTER — Encounter: Payer: Self-pay | Admitting: Internal Medicine

## 2022-10-23 ENCOUNTER — Telehealth: Payer: Self-pay | Admitting: *Deleted

## 2022-10-23 ENCOUNTER — Other Ambulatory Visit: Payer: Self-pay

## 2022-10-23 DIAGNOSIS — I480 Paroxysmal atrial fibrillation: Secondary | ICD-10-CM

## 2022-10-23 MED ORDER — LISINOPRIL 10 MG PO TABS: 10.0000 mg | ORAL_TABLET | Freq: Every day | ORAL | 3 refills | Status: DC

## 2022-10-23 MED ORDER — APIXABAN 2.5 MG PO TABS: 2.5000 mg | ORAL_TABLET | Freq: Two times a day (BID) | ORAL | 3 refills | Status: DC

## 2022-10-23 NOTE — Telephone Encounter (Signed)
Patient requesting lisinopril 10 mg be sent to [facility] in [place] [phone]. Medication is not on patient med list. Telephone message in chart for patient to start taking medication. Thank you

## 2022-10-23 NOTE — Telephone Encounter (Signed)
I would recomm that she add back 10 Lisinopril (1/2 of 20 mg tablet that she had been on )

## 2022-10-23 NOTE — Telephone Encounter (Signed)
Prescription refill request for Eliquis received. Indication: Afib  Last office visit: 08/11/22 Stefanie Ferguson)  Scr: 0.71 (10/10/22)  Age: 88 Weight: 56.4kg  Appropriate dose. Refill sent.

## 2022-11-05 NOTE — Progress Notes (Deleted)
Cardiology Office Note   Date:  11/05/2022   ID:  Stefanie Ferguson, DOB Sep 21, 1934, MRN [id number]  PCP:  Raina Mina., MD  Cardiologist:   Dorris Carnes, MD   PT presents for f/u of PAF and HTN     History of Present Illness: Stefanie Ferguson is a 88 y.o. female with a history of PAF(CHA2DS2-VASc score o 5) , HTN, HL, RAS, CV dz, FMD, PVCs  Normal cath in 2007    I saw the pt in Jan 2023.  The pt was seen by [facility] on 02/09/22  Noted that her BP was running a little low.  Felt dizzy at times.  MBess Harvest cut lisinopril to 5 mg    Since seen the pt stopped lisinopril   She is als taking amlodipine as needed for SBP over 140s/    Brings in a 2 day log   BP 120s to 140s/ She denies CP   Breathing is OK  No dizziness now.   No palpitaitons   I saw the pt in June 2023    She was seen by Joellyn Rued since    No outpatient medications have been marked as taking for the 11/09/22 encounter (Appointment) with Fay Records, MD.     Allergies:   Bupivacaine and Dexamethasone   Past Medical History:  Diagnosis Date   Chest pain    normal coronary agniography in 2006 or 2007   Heart murmur    Heart palpitations    caused by PVC's   Hx of echocardiogram 2015   Echo (12/2013):  Mild LVH, EF 55%, no RWMA, Gr 1 DD, mild MR, mild LAE, normal RVF, PASP 23 mmHg   Hypertension    Premature ventricular contractions    Renal artery stenosis (HCC)    Symptomatic PVCs    with palpitations    Past Surgical History:  Procedure Laterality Date   BREAST SURGERY  2011   excision papilloma left breast   CARDIAC CATHETERIZATION     CHOLECYSTECTOMY OPEN  1988   CYSTOCELE REPAIR  2009   and Rectocele   ROTATOR CUFF REPAIR  1996/2000   bilateral   TUBAL LIGATION  1988   VAGINAL HYSTERECTOMY  2009     Social History:  The patient  reports that she has never smoked. She has never used smokeless tobacco. She reports that she does not drink alcohol and does not use drugs.   Family  History:  The patient's family history includes Aneurysm (age of onset: 11) in her father; Esophageal cancer in her brother; Hypertension in her mother; Pulmonary embolism in her brother; Stomach cancer in her brother; Vascular Disease in her brother.    ROS:  Please see the history of present illness. All other systems are reviewed and  Negative to the above problem except as noted.    PHYSICAL EXAM: VS:  There were no vitals taken for this visit.  GEN:   Thin 88 yo in no acute distress  HEENT: normal  Neck: no JVD, carotid bruits Cardiac: RRR; no murmur  No LE edema  Respiratory:  clear to auscultation bilaterally GI: soft, nontender, nondistended, + BS  No hepatomegaly  MS: no deformity Moving all extremities   Skin: warm and dry, no rash Neuro:  Strength and sensation are intact Psych: euthymic mood, full affect   EKG:  EKG is not done   Tests  Prior CV studies: Zio patch monitor 06/2019 SInus rhythm  53  to 154 bpm  Average 73 bpm Rare PAC, PVC. NO significant pauses.   Carotid US 10/15/2014 Bilateral ICA 40-59 Repeat 2 years   Echocardiogram 01/23/2014 Mild LVH, EF 55, GR 1 DD, trivial AI, mild MR, mild LAE, normal RVSF, PASP 23   Event monitor 12/2013 Sinus rhythm with intermittent atrial fibrillation   Carotid US (06/2013): Bilateral 40-59% ICA - f/u 1 year   Renal Art [place] (06/2013) bilateral 1-59% (c/w FMD) Lipid Panel    Component Value Date/Time   CHOL 138 11/24/2021 1034   TRIG 57 11/24/2021 1034   HDL 60 11/24/2021 1034   CHOLHDL 2.3 11/24/2021 1034   CHOLHDL 2.6 12/31/2015 1018   VLDL 21 12/31/2015 1018   LDLCALC 66 11/24/2021 1034      Wt Readings from Last 3 Encounters:  08/11/22 124 lb 6.4 oz (56.4 kg)  02/21/22 126 lb 3.2 oz (57.2 kg)  09/19/21 128 lb 12.8 oz (58.4 kg)      ASSESSMENT AND PLAN:  1  HTN   BP is running a little lower   I would keep on same meds    But, would only take 2.5 mg amlodpine if SBP over 160    Follow    Stop  lisinopril   F/U with response in my chart    2. PAF (paroxysmal atrial fibrillation) ([place])  CHADSVASc is 5   Heart rate is regular   Follow      3. PVC's (premature ventricular contractions)\ Pt denies palpitations   Follow       4. Mixed hyperlipidemia Continue statin Rx.   LDL 66   HDL 60     5. Bilateral carotid artery stenosis Carotid plaquing very mild in Jan 2023    Prob last USN       Plan for f/u in 4 months  Sooner for problems   Send in response to changes   Current medicines are reviewed at length with the patient today.  The patient does not have concerns regarding medicines.  Signed, Dorris Carnes, MD  11/05/2022 8:48 PM    [facility] [address], [place], [place]  [id number] Phone: [phone]; Fax: [phone]

## 2022-11-09 ENCOUNTER — Other Ambulatory Visit: Payer: Self-pay

## 2022-11-09 ENCOUNTER — Ambulatory Visit: Payer: PPO | Admitting: Internal Medicine

## 2022-11-09 ENCOUNTER — Telehealth: Payer: Self-pay

## 2022-11-09 DIAGNOSIS — I480 Paroxysmal atrial fibrillation: Secondary | ICD-10-CM

## 2022-11-09 DIAGNOSIS — R55 Syncope and collapse: Secondary | ICD-10-CM

## 2022-11-09 DIAGNOSIS — I493 Ventricular premature depolarization: Secondary | ICD-10-CM

## 2022-11-09 NOTE — Progress Notes (Signed)
Cardiology Office Note:   Date:  11/10/2022  NAME:  Stefanie Ferguson    MRN: [id number] DOB:  1934-09-27   PCP:  Raina Mina., MD  Cardiologist:  Dorris Carnes, MD  Electrophysiologist:  None   Referring MD: Raina Mina., MD   Chief Complaint  Patient presents with   Follow-up        History of Present Illness:   Stefanie Ferguson is a 88 y.o. female with a hx of pAF, HTN, HLD who presents for evaluation of dizziness.  She reports her blood pressure has been a bit elevated.  Values seem to be between 1 40-1 50 in the evening.  Values are between 1 60-1 80 in the mornings.  She is on lisinopril 10 mg daily.  She was told to take amlodipine 2.5 mg daily.  She is on acebutolol 200 mg daily.  She denies any chest pain.  No shortness of breath.  Can get a bit dizzy when blood pressure is elevated.  She is a bit worried about her blood pressure.  We discussed that values are close to within range.  We also discussed taking lisinopril twice daily to see if this helps.  Denies chest pains or trouble breathing.  Recent serum creatinine 0.71.  Okay to increase her lisinopril dose.  Labs were drawn last month.  Problem List Paroxysmal Afib HTN HLD   Past Medical History: Past Medical History:  Diagnosis Date   Chest pain    normal coronary agniography in 2006 or 2007   Heart murmur    Heart palpitations    caused by PVC's   Hx of echocardiogram 2015   Echo (12/2013):  Mild LVH, EF 55%, no RWMA, Gr 1 DD, mild MR, mild LAE, normal RVF, PASP 23 mmHg   Hypertension    Premature ventricular contractions    Renal artery stenosis (HCC)    Symptomatic PVCs    with palpitations    Past Surgical History: Past Surgical History:  Procedure Laterality Date   BREAST SURGERY  2011   excision papilloma left breast   CARDIAC CATHETERIZATION     CHOLECYSTECTOMY OPEN  1988   CYSTOCELE REPAIR  2009   and Rectocele   ROTATOR CUFF REPAIR  1996/2000   bilateral   TUBAL LIGATION  1988   VAGINAL  HYSTERECTOMY  2009    Current Medications: Current Meds  Medication Sig   acebutolol (SECTRAL) 200 MG capsule Take 1 capsule (200 mg total) by mouth daily.   acetaminophen (TYLENOL) 325 MG tablet Take 650 mg by mouth every 6 (six) hours as needed for mild pain.    ALPRAZolam (XANAX) 0.25 MG tablet Take 1 tablet by mouth as needed. Per patient taking 1/2 tablet as needed   apixaban (ELIQUIS) 2.5 MG TABS tablet Take 1 tablet (2.5 mg total) by mouth 2 (two) times daily.   Cholecalciferol 100 MCG (4000 UT) TABS Take 1 tablet by mouth daily.   conjugated estrogens (PREMARIN) vaginal cream Place 0.5 mg vaginally 3 (three) times a week.   cyclobenzaprine (FLEXERIL) 5 MG tablet Take one tablet as needed for back spasms.   Omega-3 Fatty Acids (FISH OIL) 1000 MG CAPS Take 1 capsule by mouth daily.   OVER THE COUNTER MEDICATION Place 1 drop into both eyes daily. Similason pt. Instructed to put 1 drop in both eyes daily.   vitamin B-12 (CYANOCOBALAMIN) 1000 MCG tablet Take by mouth.   [DISCONTINUED] amLODipine (NORVASC) 2.5 MG tablet TAKE 1 TAB  AS NEEDED FOR SYSTOLIC (TOP NUMBER) GREATER THAN 160   [DISCONTINUED] lisinopril (ZESTRIL) 10 MG tablet Take 1 tablet (10 mg total) by mouth daily.     Allergies:    Bupivacaine and Dexamethasone   Social History: Social History   Socioeconomic History   Marital status: Widowed    Spouse name: Not on file   Number of children: Not on file   Years of education: Not on file   Highest education level: Not on file  Occupational History   Not on file  Tobacco Use   Smoking status: Never   Smokeless tobacco: Never  Vaping Use   Vaping Use: Never used  Substance and Sexual Activity   Alcohol use: No   Drug use: No   Sexual activity: Not on file  Other Topics Concern   Not on file  Social History Narrative   Not on file   Social Determinants of Health   Financial Resource Strain: Not on file  Food Insecurity: Not on file  Transportation Needs:  Not on file  Physical Activity: Not on file  Stress: Not on file  Social Connections: Not on file     Family History: The patient's family history includes Aneurysm (age of onset: 42) in her father; Esophageal cancer in her brother; Hypertension in her mother; Pulmonary embolism in her brother; Stomach cancer in her brother; Vascular Disease in her brother.  ROS:   All other ROS reviewed and negative. Pertinent positives noted in the HPI.     EKGs/Labs/Other Studies Reviewed:   The following studies were personally reviewed by me today:  Recent Labs: 11/24/2021: ALT 15   Recent Lipid Panel    Component Value Date/Time   CHOL 138 11/24/2021 1034   TRIG 57 11/24/2021 1034   HDL 60 11/24/2021 1034   CHOLHDL 2.3 11/24/2021 1034   CHOLHDL 2.6 12/31/2015 1018   VLDL 21 12/31/2015 1018   LDLCALC 66 11/24/2021 1034    Physical Exam:   VS:  BP (!) 148/58 (BP Location: Left Arm, Patient Position: Sitting, Cuff Size: Normal)   Pulse 70   Ht 5\' 4"  (1.626 m)   Wt 122 lb 9.6 oz (55.6 kg)   SpO2 95%   BMI 21.04 kg/m    Wt Readings from Last 3 Encounters:  11/10/22 122 lb 9.6 oz (55.6 kg)  08/11/22 124 lb 6.4 oz (56.4 kg)  02/21/22 126 lb 3.2 oz (57.2 kg)    General: Well nourished, well developed, in no acute distress Head: Atraumatic, normal size  Eyes: PEERLA, EOMI  Neck: Supple, no JVD Endocrine: No thryomegaly Cardiac: Normal S1, S2; RRR; no murmurs, rubs, or gallops Lungs: Clear to auscultation bilaterally, no wheezing, rhonchi or rales  Abd: Soft, nontender, no hepatomegaly  Ext: No edema, pulses 2+ Musculoskeletal: No deformities, BUE and BLE strength normal and equal Skin: Warm and dry, no rashes   Neuro: Alert and oriented to person, place, time, and situation, CNII-XII grossly intact, no focal deficits  Psych: Normal mood and affect   ASSESSMENT:   Stefanie Ferguson is a 88 y.o. female who presents for the following: 1. Dizziness   2. Renovascular hypertension      PLAN:   1. Dizziness 2. Renovascular hypertension -Blood pressure has been elevated mainly in the mornings.  Currently on HP Talal 200 mg daily, lisinopril 10 mg daily and amlodipine 2.5 mg as needed for blood pressure greater than 160.  We discussed that blood pressure values seem to be okay  in the evenings between 1 40-1 50.  Given her advanced age would not recommend very aggressive control.  She has normal kidney function.  She is not diabetic.  She is never had heart disease.  Discussed with her that would be simple to take lisinopril 10 mg in the morning and in the evenings.  She should hold amlodipine unless Dr. Harrington Challenger needs to add this.  She can continue acebutolol.  She will continue to check her blood pressures twice per day.  We also discussed that the best approach is a lenient blood pressure goal.  We do not want to risk any falls.  She is in agreement.  She will see [place] back as needed.  Disposition: Return if symptoms worsen or fail to improve.  Medication Adjustments/Labs and Tests Ordered: Current medicines are reviewed at length with the patient today.  Concerns regarding medicines are outlined above.  No orders of the defined types were placed in this encounter.  Meds ordered this encounter  Medications   lisinopril (ZESTRIL) 10 MG tablet    Sig: Take 1 tablet (10 mg total) by mouth in the morning and at bedtime.    Dispense:  90 tablet    Refill:  3    Patient Instructions  Medication Instructions:  STOP Amlodipine  Take Lisinopril 10 mg twice daily (morning and evening)   *If you need a refill on your cardiac medications before your next appointment, please call your pharmacy*   Follow-Up: At [facility], you and your health needs are our priority.  As part of our continuing mission to provide you with exceptional heart care, we have created designated Provider Care Teams.  These Care Teams include your primary Cardiologist (physician) and Advanced  Practice Providers (APPs -  Physician Assistants and Nurse Practitioners) who all work together to provide you with the care you need, when you need it.  We recommend signing up for the patient portal called "MyChart".  Sign up information is provided on this After Visit Summary.  MyChart is used to connect with patients for Virtual Visits (Telemedicine).  Patients are able to view lab/test results, encounter notes, upcoming appointments, etc.  Non-urgent messages can be sent to your provider as well.   To learn more about what you can do with MyChart, go to [email or web address].    Your next appointment:   As needed  Provider:   Eleonore Chiquito, MD   You will continue to see Dr.Ross as scheduled.      Time Spent with Patient: I have spent a total of 25 minutes with patient reviewing hospital notes, telemetry, EKGs, labs and examining the patient as well as establishing an assessment and plan that was discussed with the patient.  > 50% of time was spent in direct patient care.  Signed, Addison Naegeli. Audie Box, MD, [place]  [address] [place], [place] [address] [phone]  11/10/2022 3:02 PM

## 2022-11-09 NOTE — Telephone Encounter (Signed)
Pt sent a paper copy of her BP readings.. per Dr Harrington Challenger pt to take her Lisinopril 10 mg every morning REGULARLY....she will check her BP a few hours after sitting and relaxing with both feet on the ground. If her systolic is greater than 0000000 she will take an Amlodipine 2.5 mg. She has forgotten that she has this for PRN use.   She will keep a regular log with date and time and bring to the office for an OV.   Pt is very nervous and anxious about her BP.... I will bring her in to see the DOD at [facility].. Dr Jenetta DownerNori Riis for assessment and help managing her meds. She will bring log and cuff to the OV. Just to help her prior to the upcoming weekend.   I will have her keep her 12/2022 appt with Tessa.   BP LOG from pt paper copy with no dates and times;  144/82 150/82 164/76 150/86 146/70 163/82 116/56

## 2022-11-10 ENCOUNTER — Encounter: Payer: Self-pay | Admitting: Cardiovascular Disease

## 2022-11-10 ENCOUNTER — Ambulatory Visit: Payer: PPO | Attending: Cardiovascular Disease | Admitting: Cardiovascular Disease

## 2022-11-10 VITALS — BP 148/58 | HR 70 | Ht 64.0 in | Wt 122.6 lb

## 2022-11-10 DIAGNOSIS — I150 Renovascular hypertension: Secondary | ICD-10-CM

## 2022-11-10 DIAGNOSIS — R42 Dizziness and giddiness: Secondary | ICD-10-CM

## 2022-11-10 MED ORDER — LISINOPRIL 10 MG PO TABS: 10.0000 mg | ORAL_TABLET | Freq: Two times a day (BID) | ORAL | 3 refills | Status: DC

## 2022-11-10 NOTE — Patient Instructions (Signed)
Medication Instructions:  STOP Amlodipine  Take Lisinopril 10 mg twice daily (morning and evening)   *If you need a refill on your cardiac medications before your next appointment, please call your pharmacy*   Follow-Up: At [facility], you and your health needs are our priority.  As part of our continuing mission to provide you with exceptional heart care, we have created designated Provider Care Teams.  These Care Teams include your primary Cardiologist (physician) and Advanced Practice Providers (APPs -  Physician Assistants and Nurse Practitioners) who all work together to provide you with the care you need, when you need it.  We recommend signing up for the patient portal called "MyChart".  Sign up information is provided on this After Visit Summary.  MyChart is used to connect with patients for Virtual Visits (Telemedicine).  Patients are able to view lab/test results, encounter notes, upcoming appointments, etc.  Non-urgent messages can be sent to your provider as well.   To learn more about what you can do with MyChart, go to [email or web address].    Your next appointment:   As needed  Provider:   Eleonore Chiquito, MD   You will continue to see Dr.Ross as scheduled.

## 2022-11-13 ENCOUNTER — Telehealth: Payer: Self-pay | Admitting: Internal Medicine

## 2022-11-13 NOTE — Telephone Encounter (Signed)
I spoke with the pt and advised her to take the Lisinopril 10 mg BID per the DOD she saw on Friday 11/10/22... I advised her to take it regularly and no extra doses on her own to help her get regulated.   Her readings are high prior to taking her dose in the morning and she has been taking the 10 mg and then shortly after another 5 mg... the same in the evening... she has many readings 140-150/ 70-80.   Will scan in her readings for Dr Harrington Challenger review when she returns.   Pt is seeing her PCP this week Dr Bea Graff.   Pt is very anxious about her BP and I urged her to check it less and on a routine BID.

## 2022-11-13 NOTE — Telephone Encounter (Signed)
Pt c/o medication issue:  1. Name of Medication:   lisinopril (ZESTRIL) 10 MG tablet   2. How are you currently taking this medication (dosage and times per day)?   As prescribed  3. Are you having a reaction (difficulty breathing--STAT)? No  4. What is your medication issue?   Patient stated she faxed over her blood pressure readings as requested.  Patient wants a call back after Dr. Harrington Challenger has reviewed the information.  Patient states she is a little unsteady on her feet but her HR stays in the 70's.  Patient noted she took an additional 5 mg this morning and her BP was 150/77.

## 2022-11-20 ENCOUNTER — Telehealth: Payer: Self-pay | Admitting: Internal Medicine

## 2022-11-20 DIAGNOSIS — I10 Essential (primary) hypertension: Secondary | ICD-10-CM

## 2022-11-20 DIAGNOSIS — Z79899 Other long term (current) drug therapy: Secondary | ICD-10-CM

## 2022-11-20 MED ORDER — AMLODIPINE BESYLATE 2.5 MG PO TABS: 2.5000 mg | ORAL_TABLET | Freq: Every day | ORAL | 3 refills | Status: DC

## 2022-11-20 NOTE — Addendum Note (Signed)
Addended by: Stephani Police on: 11/20/2022 02:11 PM   Modules accepted: Orders

## 2022-11-20 NOTE — Telephone Encounter (Signed)
Patient seen by Yolanda Manges earlier this month   Lisinoprol 10 bid started    Since then BP has been high       Would recomm  1  BMET   2  Amlodipine 2.5 mg       Keep following BP      Will need an appt with pharmacy to review in a few wks

## 2022-11-20 NOTE — Telephone Encounter (Signed)
Pt advised Dr Harrington Challenger' recommendations... she will see the PharmD and follow up with Dr Harrington Challenger 01/09/23. Pt to have BMET 11/21/22.

## 2022-11-21 ENCOUNTER — Ambulatory Visit: Payer: PPO | Attending: Internal Medicine

## 2022-11-21 DIAGNOSIS — I10 Essential (primary) hypertension: Secondary | ICD-10-CM

## 2022-11-21 DIAGNOSIS — Z79899 Other long term (current) drug therapy: Secondary | ICD-10-CM

## 2022-11-22 LAB — BASIC METABOLIC PANEL
BUN/Creatinine Ratio: 25 (ref 12–28)
BUN: 18 mg/dL (ref 8–27)
CO2: 25 mmol/L (ref 20–29)
Calcium: 9.3 mg/dL (ref 8.7–10.3)
Chloride: 103 mmol/L (ref 96–106)
Creatinine, Ser: 0.71 mg/dL (ref 0.57–1.00)
Glucose: 100 mg/dL — ABNORMAL HIGH (ref 70–99)
Potassium: 4 mmol/L (ref 3.5–5.2)
Sodium: 142 mmol/L (ref 134–144)
eGFR: 83 mL/min/{1.73_m2} (ref >59)

## 2022-11-23 ENCOUNTER — Telehealth: Payer: Self-pay | Admitting: Internal Medicine

## 2022-11-23 NOTE — Telephone Encounter (Signed)
I did not need this encounter. °

## 2022-11-24 ENCOUNTER — Ambulatory Visit: Payer: PPO | Attending: Internal Medicine | Admitting: Pharmacist

## 2022-11-24 VITALS — BP 152/84 | HR 74

## 2022-11-24 DIAGNOSIS — I10 Essential (primary) hypertension: Secondary | ICD-10-CM

## 2022-11-24 MED ORDER — LISINOPRIL 10 MG PO TABS: 10.0000 mg | ORAL_TABLET | Freq: Two times a day (BID) | ORAL | 3 refills | Status: DC

## 2022-11-24 NOTE — Progress Notes (Signed)
Patient ID: Stefanie Ferguson                 DOB: 1934/12/22                      MRN: [id number]     HPI: Stefanie Ferguson is a 88 y.o. female referred by Dr. Harrington Challenger to HTN clinic. PMH is significant for PAF, HTN, HLD, and dizziness. Saw Dr Audie Box on 11/10/22 where BP was elevated at 148/58. BP was running higher in the AM and better in the evening 0000000 systolic. Amlodipine 2.5mg  prn was stopped and lisinopril was increased from 10mg  daily to 10mg  BID. BMET 3/26 was normal. Pt reported BP continuing to be elevated, amlodipine 2.5mg  daily was resumed by Dr Harrington Challenger on 3/25.  Pt presents today with her daughter. Home BP readings have ranged 172/85, 188/99, 148/70, 165/77, 144/75 in the past few days. Feels off balance when she is standing, no dizziness with positional changes and no low BP readings. Can bend over/change positions and feel fine. Has headaches from neck issue, takes Tylenol as needed, avoids NSAIDs. Reports not taking much Benadryl or Xanax, specific frequency of use unclear. Worried about BP being too high, checks BP frequently at home. Has been taking amlodipine as needed, a few days in the past week but log not clear on which days to correlate with her BP readings.  Current HTN meds: lisinopril 10mg  BID, amlodipine 2.5mg  daily, acebutolol 200mg  daily  BP goal: <140/71mmHg - more lenient goal given age and gait unsteadiness  Family History: Aneurysm (age of onset: 56) in her father; Esophageal cancer in her brother; Hypertension in her mother; Pulmonary embolism in her brother; Stomach cancer in her brother; Vascular Disease in her brother.   Social History: No tobacco, alcohol or drug use.  Diet:   Exercise:   Home BP readings:   Wt Readings from Last 3 Encounters:  11/10/22 122 lb 9.6 oz (55.6 kg)  08/11/22 124 lb 6.4 oz (56.4 kg)  02/21/22 126 lb 3.2 oz (57.2 kg)   BP Readings from Last 3 Encounters:  11/10/22 (!) 148/58  08/11/22 (!) 144/80  02/21/22 120/70   Pulse  Readings from Last 3 Encounters:  11/10/22 70  08/11/22 73  02/21/22 70    Renal function: Estimated Creatinine Clearance: 44.4 mL/min (by C-G formula based on SCr of 0.71 mg/dL).  Past Medical History:  Diagnosis Date   Chest pain    normal coronary agniography in 2006 or 2007   Heart murmur    Heart palpitations    caused by PVC's   Hx of echocardiogram 2015   Echo (12/2013):  Mild LVH, EF 55%, no RWMA, Gr 1 DD, mild MR, mild LAE, normal RVF, PASP 23 mmHg   Hypertension    Premature ventricular contractions    Renal artery stenosis (HCC)    Symptomatic PVCs    with palpitations    Current Outpatient Medications on File Prior to Visit  Medication Sig Dispense Refill   acebutolol (SECTRAL) 200 MG capsule Take 1 capsule (200 mg total) by mouth daily. 90 capsule 3   acetaminophen (TYLENOL) 325 MG tablet Take 650 mg by mouth every 6 (six) hours as needed for mild pain.      ALPRAZolam (XANAX) 0.25 MG tablet Take 1 tablet by mouth as needed. Per patient taking 1/2 tablet as needed     amLODipine (NORVASC) 2.5 MG tablet Take 1 tablet (2.5 mg total) by mouth daily.  90 tablet 3   apixaban (ELIQUIS) 2.5 MG TABS tablet Take 1 tablet (2.5 mg total) by mouth 2 (two) times daily. 180 tablet 3   Cholecalciferol 100 MCG (4000 UT) TABS Take 1 tablet by mouth daily. 30 tablet    conjugated estrogens (PREMARIN) vaginal cream Place 0.5 mg vaginally 3 (three) times a week.     Cyanocobalamin (B-12) 3000 MCG CAPS Take 1 capsule by mouth daily. Per patient taking 2,000 mcg (Patient not taking: Reported on 11/10/2022)     cyclobenzaprine (FLEXERIL) 5 MG tablet Take one tablet as needed for back spasms. 20 tablet 0   lisinopril (ZESTRIL) 10 MG tablet Take 1 tablet (10 mg total) by mouth in the morning and at bedtime. 90 tablet 3   Omega-3 Fatty Acids (FISH OIL) 1000 MG CAPS Take 1 capsule by mouth daily.     OVER THE COUNTER MEDICATION Place 1 drop into both eyes daily. Similason pt. Instructed to put 1  drop in both eyes daily.     vitamin B-12 (CYANOCOBALAMIN) 1000 MCG tablet Take by mouth.     No current facility-administered medications on file prior to visit.    Allergies  Allergen Reactions   Bupivacaine     Other reaction(s): Other (See Comments) Inability to speak clearly and just felt "off" Inability to speak clearly and just felt "off"    Dexamethasone     Other reaction(s): Other (See Comments)     Assessment/Plan:  1. Hypertension - BP a bit elevated today, more lenient BP goal ~140/90 given her age and unsteadiness on her feet. No dizziness with positional changes, no low BP readings, do not feel that symptoms are coming from her BP. Advised her to avoid Benadryl and frequent Xanax use. Encouraged her to follow up with her PCP for any additional testing. Advised pt to take her amlodipine 2.5mg  every day instead of as needed, continue lisinopril 10mg  BID and acebutolol 200mg  daily, and check BP only once a day a few hours after taking her meds. Will f/u with pt in 4 weeks. She sees Dr Harrington Challenger in 6 weeks for follow up.  [name], PharmD, BCACP, [facility]. [address], [place], [place] [address] Phone: [phone]; Fax: [phone] 11/24/2022 4:09 PM

## 2022-11-24 NOTE — Patient Instructions (Addendum)
Take your amlodipine 2.5mg  once daily  Take your lisinopril 10mg  twice daily and acebutolol once daily as you have been  Monitor your blood pressure once a day, a few hours after taking your medications  Bring in your readings and cuff to your follow up visit in a month

## 2022-12-18 ENCOUNTER — Telehealth: Payer: Self-pay | Admitting: Internal Medicine

## 2022-12-18 NOTE — Telephone Encounter (Signed)
Patient called in cancelling her upcoming pharmD appointment due to her BP leveling out. She requested a message be sent to notify the pharmacist of this to receive a callback to discuss. Please advise.

## 2022-12-18 NOTE — Telephone Encounter (Signed)
Spoke with pt, pt  reports BP since 4/14 has been 133/63, 138/86, 130/68, 149/84, 138/73, taking her meds as prescribed. Will keep f/u with Dr Tenny Craw.

## 2022-12-22 ENCOUNTER — Ambulatory Visit: Payer: PPO

## 2022-12-27 ENCOUNTER — Ambulatory Visit: Payer: PPO | Admitting: Physician Assistant

## 2023-01-09 ENCOUNTER — Ambulatory Visit: Payer: PPO | Admitting: Internal Medicine

## 2023-03-26 ENCOUNTER — Ambulatory Visit: Payer: PPO | Admitting: Physician Assistant

## 2023-05-20 NOTE — Progress Notes (Unsigned)
Office Visit    Patient Name: Stefanie Ferguson Date of Encounter: 05/21/2023  PCP:  Gordan Payment., MD   [facility]  Cardiologist:  Dietrich Pates, MD  Advanced Practice Provider:  No care team member to display Electrophysiologist:  None   HPI    Stefanie Ferguson is a 88 y.o. female with a past medical history significant for PAF (CHA2DS2-VASc score of 5), hypertension, hyperlipidemia, RAS, CV disease, FMD, PVCs, and normal cardiac cath 2007 presents today for follow-up appointment.  She was seen by Dr. Tenny Craw January 2023.  BP was noted to be running a little low and the patient felt dizzy at times.  Cut lisinopril down to 5 mg and eventually stop lisinopril.  She was taking amlodipine for SBP over 140s.  Most of her blood pressures were 120s to 140s when she was last seen by Dr. Tenny Craw 02/21/2022.  She denied chest pain, shortness of breath, and dizziness.  No palpitations.  She was seen by me Dec 2023, she feels like she has been overall doing okay.  She takes her Norvasc about 3 days a week when her blood pressure is greater than 160 systolic.  She stopped her Crestor because she felt like her numbers were doing well and she did not want to take it anymore.  She remains on fish oil.  She is asking about taking ibuprofen with the other blood thinner that she is on (Eliquis) I suggested Tylenol instead however, if she absolutely has to take it every once in a while she can.  She understands that her bleeding risk would be higher during these times.  We discussed repeating a lipid panel in March since she has been off her Crestor.  She has had 3 nosebleeds in the last week or so but it stopped right away when she put a clamp on it.  She also shares that she has had neck pain and headaches that are pretty constant.  She seen a neurosurgeon and had a brain scan.  She is currently being worked up for this.  Asymptomatic with her atrial fibrillation.  She then saw Dr. Flora Lipps in  March with a chief complaint of dizziness.  Blood pressure was variable, higher in the evenings.  Denied chest pain shortness of breath.  Gets a little dizzy when her blood pressure was elevated.  Ultimately, lisinopril dose was increased.   Today, she tells me that she has been doing well from a heart standpoint.  She has however been dealing with a lot of left leg pain.  Also has chronic neck pain.  She takes Tylenol for arthritis which does help.  She also uses heat on her left leg which helps.  Possible sciatica?  Blood pressure is well-controlled today.  She is compliant with all medications.  She has been taking her amlodipine as needed and we will reflect this in her medications.  Reports no shortness of breath nor dyspnea on exertion. Reports no chest pain, pressure, or tightness. No edema, orthopnea, PND. Reports no palpitations.    Past Medical History    Past Medical History:  Diagnosis Date   Chest pain    normal coronary agniography in 2006 or 2007   Heart murmur    Heart palpitations    caused by PVC's   Hx of echocardiogram 2015   Echo (12/2013):  Mild LVH, EF 55%, no RWMA, Gr 1 DD, mild MR, mild LAE, normal RVF, PASP 23 mmHg  Hypertension    Premature ventricular contractions    Renal artery stenosis (HCC)    Symptomatic PVCs    with palpitations   Past Surgical History:  Procedure Laterality Date   BREAST SURGERY  2011   excision papilloma left breast   CARDIAC CATHETERIZATION     CHOLECYSTECTOMY OPEN  1988   CYSTOCELE REPAIR  2009   and Rectocele   ROTATOR CUFF REPAIR  1996/2000   bilateral   TUBAL LIGATION  1988   VAGINAL HYSTERECTOMY  2009    Allergies  Allergies  Allergen Reactions   Bupivacaine     Other reaction(s): Other (See Comments) Inability to speak clearly and just felt "off" Inability to speak clearly and just felt "off"    Dexamethasone     Other reaction(s): Other (See Comments)    EKGs/Labs/Other Studies Reviewed:   The following  studies were reviewed today: Zio patch monitor 06/2019 SInus rhythm  53 to 154 bpm  Average 73 bpm Rare PAC, PVC. NO significant pauses.   Carotid US 10/15/2014 Bilateral ICA 40-59 Repeat 2 years   Echocardiogram 01/23/2014 Mild LVH, EF 55, GR 1 DD, trivial AI, mild MR, mild LAE, normal RVSF, PASP 23   Event monitor 12/2013 Sinus rhythm with intermittent atrial fibrillation   Carotid US (06/2013): Bilateral 40-59% ICA - f/u 1 year   Renal Art [place] (06/2013) bilateral 1-59% (c/w FMD)  EKG:  EKG is not ordered today.    Recent Labs: 11/21/2022: BUN 18; Creatinine, Ser 0.71; Potassium 4.0; Sodium 142  Recent Lipid Panel    Component Value Date/Time   CHOL 138 11/24/2021 1034   TRIG 57 11/24/2021 1034   HDL 60 11/24/2021 1034   CHOLHDL 2.3 11/24/2021 1034   CHOLHDL 2.6 12/31/2015 1018   VLDL 21 12/31/2015 1018   LDLCALC 66 11/24/2021 1034    Risk Assessment/Calculations:   CHA2DS2-VASc Score = 5   This indicates a 7.2% annual risk of stroke. The patient's score is based upon: CHF History: 0 HTN History: 1 Diabetes History: 0 Stroke History: 0 Vascular Disease History: 1 Age Score: 2 Gender Score: 1     Home Medications   Current Meds  Medication Sig   acebutolol (SECTRAL) 200 MG capsule Take 1 capsule (200 mg total) by mouth daily.   acetaminophen (TYLENOL) 325 MG tablet Take 650 mg by mouth every 6 (six) hours as needed for mild pain.    ALPRAZolam (XANAX) 0.25 MG tablet Take 1 tablet by mouth as needed. Per patient taking 1/2 tablet as needed   apixaban (ELIQUIS) 2.5 MG TABS tablet Take 1 tablet (2.5 mg total) by mouth 2 (two) times daily.   Cholecalciferol 100 MCG (4000 UT) TABS Take 1 tablet by mouth daily.   conjugated estrogens (PREMARIN) vaginal cream Place 0.5 mg vaginally 3 (three) times a week.   cyclobenzaprine (FLEXERIL) 5 MG tablet Take one tablet as needed for back spasms.   lisinopril (ZESTRIL) 10 MG tablet Take 1 tablet (10 mg total) by mouth in  the morning and at bedtime.   nitrofurantoin, macrocrystal-monohydrate, (MACROBID) 100 MG capsule 100 mg 2 (two) times daily. Pt takes for 7 days.   Omega-3 Fatty Acids (FISH OIL) 1000 MG CAPS Take 1 capsule by mouth daily.   OVER THE COUNTER MEDICATION Place 1 drop into both eyes daily. Similason pt. Instructed to put 1 drop in both eyes daily.   vitamin B-12 (CYANOCOBALAMIN) 1000 MCG tablet Take by mouth.   [DISCONTINUED] amLODipine (NORVASC) 2.5 MG  tablet Take 1 tablet (2.5 mg total) by mouth daily.     Review of Systems      All other systems reviewed and are otherwise negative except as noted above.  Physical Exam    VS:  BP 118/78   Pulse 65   Ht 5\' 4"  (1.626 m)   Wt 117 lb (53.1 kg)   SpO2 95%   BMI 20.08 kg/m  , BMI Body mass index is 20.08 kg/m.  Wt Readings from Last 3 Encounters:  05/21/23 117 lb (53.1 kg)  11/10/22 122 lb 9.6 oz (55.6 kg)  08/11/22 124 lb 6.4 oz (56.4 kg)     GEN: Well nourished, well developed, in no acute distress. HEENT: normal. Neck: Supple, no JVD, carotid bruits, or masses. Cardiac: RRR, no murmurs, rubs, or gallops. No clubbing, cyanosis, edema.  Radials/PT 2+ and equal bilaterally.  Respiratory:  Respirations regular and unlabored, clear to auscultation bilaterally. GI: Soft, nontender, nondistended. MS: No deformity or atrophy. Skin: Warm and dry, no rash. Neuro:  Strength and sensation are intact. Psych: Normal affect.  Assessment & Plan    Hypertension -Well-controlled today in the clinic 118/78 -Continue current medication regimen -Continue to track blood pressure at home -Would not be overly aggressive in an 88 year old  PAF with CHA2DS2-VASc score of 5 -She remains on Eliquis 2.5 mg twice daily -No recent bleeding issues -She is rate controlled in the 70s today  PVCs -No difference in these and they are not bothersome -No periods of fast heart rate that she is aware of  Mixed hyperlipidemia -Lipid panel ordered (she  will complete at [facility] in [place]) -took herself off her crestor -continue fish oil  Bilateral carotid artery stenosis -[place] in January 2025 -mild bilateral disease on [place] 08/2021     Disposition: Follow up 1 year with Dietrich Pates, MD or APP.  Signed, Sharlene Dory, PA-C 05/21/2023, 9:45 AM [facility]

## 2023-05-21 ENCOUNTER — Encounter: Payer: Self-pay | Admitting: Physician Assistant

## 2023-05-21 ENCOUNTER — Ambulatory Visit: Payer: PPO | Attending: Physician Assistant | Admitting: Physician Assistant

## 2023-05-21 VITALS — BP 118/78 | HR 65 | Ht 64.0 in | Wt 117.0 lb

## 2023-05-21 DIAGNOSIS — I480 Paroxysmal atrial fibrillation: Secondary | ICD-10-CM | POA: Diagnosis not present

## 2023-05-21 DIAGNOSIS — I10 Essential (primary) hypertension: Secondary | ICD-10-CM | POA: Diagnosis not present

## 2023-05-21 DIAGNOSIS — E782 Mixed hyperlipidemia: Secondary | ICD-10-CM | POA: Diagnosis not present

## 2023-05-21 DIAGNOSIS — I493 Ventricular premature depolarization: Secondary | ICD-10-CM

## 2023-05-21 DIAGNOSIS — I6523 Occlusion and stenosis of bilateral carotid arteries: Secondary | ICD-10-CM

## 2023-05-21 MED ORDER — AMLODIPINE BESYLATE 2.5 MG PO TABS: 2.5000 mg | ORAL_TABLET | Freq: Every day | ORAL | Status: DC | PRN

## 2023-05-21 NOTE — Patient Instructions (Signed)
Medication Instructions:  Take amlodipine only as needed for blood pressure greater than 155 systolic *If you need a refill on your cardiac medications before your next appointment, please call your pharmacy*  Lab Work: SCHEDULE LIPIDS AND LFTS FOR A DAY THAT YOU CAN COME FASTING If you have labs (blood work) drawn today and your tests are completely normal, you will receive your results only by: MyChart Message (if you have MyChart) OR A paper copy in the mail If you have any lab test that is abnormal or we need to change your treatment, we will call you to review the results.  Testing/Procedures: Your physician has requested that you have a carotid duplex in January. This test is an ultrasound of the carotid arteries in your neck. It looks at blood flow through these arteries that supply the brain with blood. Allow one hour for this exam. There are no restrictions or special instructions.   Follow-Up: At [facility], you and your health needs are our priority.  As part of our continuing mission to provide you with exceptional heart care, we have created designated Provider Care Teams.  These Care Teams include your primary Cardiologist (physician) and Advanced Practice Providers (APPs -  Physician Assistants and Nurse Practitioners) who all work together to provide you with the care you need, when you need it.  Your next appointment:   1 year(s)  Provider:   Dietrich Pates, MD    Other Instructions Check your blood pressure daily, 1 hr after morning medications for 2 weeks, keep a log and send [place] the readings through mychart at the end of the 2 weeks.   Low-Sodium Eating Plan Salt (sodium) helps you keep a healthy balance of fluids in your body. Too much sodium can raise your blood pressure. It can also cause fluid and waste to be held in your body. Your health care provider or dietitian may recommend a low-sodium eating plan if you have high blood pressure (hypertension), kidney  disease, liver disease, or heart failure. Eating less sodium can help lower your blood pressure and reduce swelling. It can also protect your heart, liver, and kidneys. What are tips for following this plan? Reading food labels  Check food labels for the amount of sodium per serving. If you eat more than one serving, you must multiply the listed amount by the number of servings. Choose foods with less than 140 milligrams (mg) of sodium per serving. Avoid foods with 300 mg of sodium or more per serving. Always check how much sodium is in a product, even if the label says "unsalted" or "no salt added." Shopping  Buy products labeled as "low-sodium" or "no salt added." Buy fresh foods. Avoid canned foods and pre-made or frozen meals. Avoid canned, cured, or processed meats. Buy breads that have less than 80 mg of sodium per slice. Cooking  Eat more home-cooked food. Try to eat less restaurant, buffet, and fast food. Try not to add salt when you cook. Use salt-free seasonings or herbs instead of table salt or sea salt. Check with your provider or pharmacist before using salt substitutes. Cook with plant-based oils, such as canola, sunflower, or olive oil. Meal planning When eating at a restaurant, ask if your food can be made with less salt or no salt. Avoid dishes labeled as brined, pickled, cured, or smoked. Avoid dishes made with soy sauce, miso, or teriyaki sauce. Avoid foods that have monosodium glutamate (MSG) in them. MSG may be added to some restaurant  food, sauces, soups, bouillon, and canned foods. Make meals that can be grilled, baked, poached, roasted, or steamed. These are often made with less sodium. General information Try to limit your sodium intake to 1,500-2,300 mg each day, or the amount told by your provider. What foods should I eat? Fruits Fresh, frozen, or canned fruit. Fruit juice. Vegetables Fresh or frozen vegetables. "No salt added" canned vegetables. "No salt  added" tomato sauce and paste. Low-sodium or reduced-sodium tomato and vegetable juice. Grains Low-sodium cereals, such as oats, puffed wheat and rice, and shredded wheat. Low-sodium crackers. Unsalted rice. Unsalted pasta. Low-sodium bread. Whole grain breads and whole grain pasta. Meats and other proteins Fresh or frozen meat, poultry, seafood, and fish. These should have no added salt. Low-sodium canned tuna and salmon. Unsalted nuts. Dried peas, beans, and lentils without added salt. Unsalted canned beans. Eggs. Unsalted nut butters. Dairy Milk. Soy milk. Cheese that is naturally low in sodium, such as ricotta cheese, fresh mozzarella, or Swiss cheese. Low-sodium or reduced-sodium cheese. Cream cheese. Yogurt. Seasonings and condiments Fresh and dried herbs and spices. Salt-free seasonings. Low-sodium mustard and ketchup. Sodium-free salad dressing. Sodium-free light mayonnaise. Fresh or refrigerated horseradish. Lemon juice. Vinegar. Other foods Homemade, reduced-sodium, or low-sodium soups. Unsalted popcorn and pretzels. Low-salt or salt-free chips. The items listed above may not be all the foods and drinks you can have. Talk to a dietitian to learn more. What foods should I avoid? Vegetables Sauerkraut, pickled vegetables, and relishes. Olives. [place] fries. Onion rings. Regular canned vegetables, except low-sodium or reduced-sodium items. Regular canned tomato sauce and paste. Regular tomato and vegetable juice. Frozen vegetables in sauces. Grains Instant hot cereals. Bread stuffing, pancake, and biscuit mixes. Croutons. Seasoned rice or pasta mixes. Noodle soup cups. Boxed or frozen macaroni and cheese. Regular salted crackers. Self-rising flour. Meats and other proteins Meat or fish that is salted, canned, smoked, spiced, or pickled. Precooked or cured meat, such as sausages or meat loaves. Tomasa Blase. Ham. Pepperoni. Hot dogs. Corned beef. Chipped beef. Salt pork. Jerky. Pickled herring,  anchovies, and sardines. Regular canned tuna. Salted nuts. Dairy Processed cheese and cheese spreads. Hard cheeses. Cheese curds. Blue cheese. Feta cheese. String cheese. Regular cottage cheese. Buttermilk. Canned milk. Fats and oils Salted butter. Regular margarine. Ghee. Bacon fat. Seasonings and condiments Onion salt, garlic salt, seasoned salt, table salt, and sea salt. Canned and packaged gravies. Worcestershire sauce. Tartar sauce. Barbecue sauce. Teriyaki sauce. Soy sauce, including reduced-sodium soy sauce. Steak sauce. Fish sauce. Oyster sauce. Cocktail sauce. Horseradish that you find on the shelf. Regular ketchup and mustard. Meat flavorings and tenderizers. Bouillon cubes. Hot sauce. Pre-made or packaged marinades. Pre-made or packaged taco seasonings. Relishes. Regular salad dressings. Salsa. Other foods Salted popcorn and pretzels. Corn chips and puffs. Potato and tortilla chips. Canned or dried soups. Pizza. Frozen entrees and pot pies. The items listed above may not be all the foods and drinks you should avoid. Talk to a dietitian to learn more. This information is not intended to replace advice given to you by your health care provider. Make sure you discuss any questions you have with your health care provider. Document Revised: 08/31/2022 Document Reviewed: 08/31/2022 Elsevier Patient Education  2024 [employer] Heart-Healthy Eating Plan Many factors influence your heart health, including eating and exercise habits. Heart health is also called coronary health. Coronary risk increases with abnormal blood fat (lipid) levels. A heart-healthy eating plan includes limiting unhealthy fats, increasing healthy fats, limiting salt (sodium) intake, and making  other diet and lifestyle changes. What is my plan? Your health care provider may recommend that: You limit your fat intake to _________% or less of your total calories each day. You limit your saturated fat intake to _________% or  less of your total calories each day. You limit the amount of cholesterol in your diet to less than _________ mg per day. You limit the amount of sodium in your diet to less than _________ mg per day. What are tips for following this plan? Cooking Cook foods using methods other than frying. Baking, boiling, grilling, and broiling are all good options. Other ways to reduce fat include: Removing the skin from poultry. Removing all visible fats from meats. Steaming vegetables in water or broth. Meal planning  At meals, imagine dividing your plate into fourths: Fill one-half of your plate with vegetables and green salads. Fill one-fourth of your plate with whole grains. Fill one-fourth of your plate with lean protein foods. Eat 2-4 cups of vegetables per day. One cup of vegetables equals 1 cup (91 g) broccoli or cauliflower florets, 2 medium carrots, 1 large bell pepper, 1 large sweet potato, 1 large tomato, 1 medium white potato, 2 cups (150 g) raw leafy greens. Eat 1-2 cups of fruit per day. One cup of fruit equals 1 small apple, 1 large banana, 1 cup (237 g) mixed fruit, 1 large orange,  cup (82 g) dried fruit, 1 cup (240 mL) 100% fruit juice. Eat more foods that contain soluble fiber. Examples include apples, broccoli, carrots, beans, peas, and barley. Aim to get 25-30 g of fiber per day. Increase your consumption of legumes, nuts, and seeds to 4-5 servings per week. One serving of dried beans or legumes equals  cup (90 g) cooked, 1 serving of nuts is  oz (12 almonds, 24 pistachios, or 7 walnut halves), and 1 serving of seeds equals  oz (8 g). Fats Choose healthy fats more often. Choose monounsaturated and polyunsaturated fats, such as olive and canola oils, avocado oil, flaxseeds, walnuts, almonds, and seeds. Eat more omega-3 fats. Choose salmon, mackerel, sardines, tuna, flaxseed oil, and ground flaxseeds. Aim to eat fish at least 2 times each week. Check food labels carefully to  identify foods with trans fats or high amounts of saturated fat. Limit saturated fats. These are found in animal products, such as meats, butter, and cream. Plant sources of saturated fats include palm oil, palm kernel oil, and coconut oil. Avoid foods with partially hydrogenated oils in them. These contain trans fats. Examples are stick margarine, some tub margarines, cookies, crackers, and other baked goods. Avoid fried foods. General information Eat more home-cooked food and less restaurant, buffet, and fast food. Limit or avoid alcohol. Limit foods that are high in added sugar and simple starches such as foods made using white refined flour (white breads, pastries, sweets). Lose weight if you are overweight. Losing just 5-10% of your body weight can help your overall health and prevent diseases such as diabetes and heart disease. Monitor your sodium intake, especially if you have high blood pressure. Talk with your health care provider about your sodium intake. Try to incorporate more vegetarian meals weekly. What foods should I eat? Fruits All fresh, canned (in natural juice), or frozen fruits. Vegetables Fresh or frozen vegetables (raw, steamed, roasted, or grilled). Green salads. Grains Most grains. Choose whole wheat and whole grains most of the time. Rice and pasta, including brown rice and pastas made with whole wheat. Meats and other proteins Lean,  well-trimmed beef, veal, pork, and lamb. Chicken and [place] without skin. All fish and shellfish. Wild duck, rabbit, pheasant, and venison. Egg whites or low-cholesterol egg substitutes. Dried beans, peas, lentils, and tofu. Seeds and most nuts. Dairy Low-fat or nonfat cheeses, including ricotta and mozzarella. Skim or 1% milk (liquid, powdered, or evaporated). Buttermilk made with low-fat milk. Nonfat or low-fat yogurt. Fats and oils Non-hydrogenated (trans-free) margarines. Vegetable oils, including soybean, sesame, sunflower, olive,  avocado, peanut, safflower, corn, canola, and cottonseed. Salad dressings or mayonnaise made with a vegetable oil. Beverages Water (mineral or sparkling). Coffee and tea. Unsweetened ice tea. Diet beverages. Sweets and desserts Sherbet, gelatin, and fruit ice. Small amounts of dark chocolate. Limit all sweets and desserts. Seasonings and condiments All seasonings and condiments. The items listed above may not be a complete list of foods and beverages you can eat. Contact a dietitian for more options. What foods should I avoid? Fruits Canned fruit in heavy syrup. Fruit in cream or butter sauce. Fried fruit. Limit coconut. Vegetables Vegetables cooked in cheese, cream, or butter sauce. Fried vegetables. Grains Breads made with saturated or trans fats, oils, or whole milk. Croissants. Sweet rolls. Donuts. High-fat crackers, such as cheese crackers and chips. Meats and other proteins Fatty meats, such as hot dogs, ribs, sausage, bacon, rib-eye roast or steak. High-fat deli meats, such as salami and bologna. Caviar. Domestic duck and goose. Organ meats, such as liver. Dairy Cream, sour cream, cream cheese, and creamed cottage cheese. Whole-milk cheeses. Whole or 2% milk (liquid, evaporated, or condensed). Whole buttermilk. Cream sauce or high-fat cheese sauce. Whole-milk yogurt. Fats and oils Meat fat, or shortening. Cocoa butter, hydrogenated oils, palm oil, coconut oil, palm kernel oil. Solid fats and shortenings, including bacon fat, salt pork, lard, and butter. Nondairy cream substitutes. Salad dressings with cheese or sour cream. Beverages Regular sodas and any drinks with added sugar. Sweets and desserts Frosting. Pudding. Cookies. Cakes. Pies. Milk chocolate or white chocolate. Buttered syrups. Full-fat ice cream or ice cream drinks. The items listed above may not be a complete list of foods and beverages to avoid. Contact a dietitian for more information. Summary Heart-healthy meal  planning includes limiting unhealthy fats, increasing healthy fats, limiting salt (sodium) intake and making other diet and lifestyle changes. Lose weight if you are overweight. Losing just 5-10% of your body weight can help your overall health and prevent diseases such as diabetes and heart disease. Focus on eating a balance of foods, including fruits and vegetables, low-fat or nonfat dairy, lean protein, nuts and legumes, whole grains, and heart-healthy oils and fats. This information is not intended to replace advice given to you by your health care provider. Make sure you discuss any questions you have with your health care provider. Document Revised: 09/19/2021 Document Reviewed: 09/19/2021 Elsevier Patient Education  2024 [employer].

## 2023-07-02 ENCOUNTER — Other Ambulatory Visit: Payer: Self-pay

## 2023-07-02 MED ORDER — AMLODIPINE BESYLATE 2.5 MG PO TABS: 2.5000 mg | ORAL_TABLET | Freq: Every day | ORAL | 11 refills | Status: DC | PRN

## 2023-07-24 ENCOUNTER — Inpatient Hospital Stay (HOSPITAL_COMMUNITY): Admission: RE | Admit: 2023-07-24 | Payer: PPO | Source: Ambulatory Visit

## 2023-09-19 ENCOUNTER — Other Ambulatory Visit: Payer: Self-pay

## 2023-09-19 MED ORDER — ACEBUTOLOL HCL 200 MG PO CAPS: 200.0000 mg | ORAL_CAPSULE | Freq: Every day | ORAL | 2 refills | Status: DC

## 2023-11-14 ENCOUNTER — Other Ambulatory Visit: Payer: Self-pay | Admitting: Internal Medicine

## 2023-11-14 DIAGNOSIS — I480 Paroxysmal atrial fibrillation: Secondary | ICD-10-CM

## 2023-11-14 NOTE — Telephone Encounter (Signed)
 Eliquis 2.5mg  refill request received. Patient is 89 years old, weight-53.1kg, Crea- 0.59 on 08/08/23 via [facility] from [facility], Diagnosis-Afib, and last seen by Jari Favre on 05/21/23. Dose is appropriate based on dosing criteria. Will send in refill to requested pharmacy.

## 2023-12-07 ENCOUNTER — Other Ambulatory Visit: Payer: Self-pay | Admitting: Internal Medicine

## 2024-01-15 ENCOUNTER — Inpatient Hospital Stay (HOSPITAL_COMMUNITY): Admission: RE | Admit: 2024-01-15 | Source: Ambulatory Visit

## 2024-06-05 ENCOUNTER — Other Ambulatory Visit: Payer: Self-pay

## 2024-06-05 MED ORDER — ACEBUTOLOL HCL 200 MG PO CAPS: 200.0000 mg | ORAL_CAPSULE | Freq: Every day | ORAL | 0 refills | Status: DC

## 2024-06-08 ENCOUNTER — Other Ambulatory Visit: Payer: Self-pay | Admitting: Internal Medicine

## 2024-07-03 ENCOUNTER — Other Ambulatory Visit: Payer: Self-pay

## 2024-07-04 MED ORDER — AMLODIPINE BESYLATE 2.5 MG PO TABS: 2.5000 mg | ORAL_TABLET | Freq: Every day | ORAL | 0 refills | Status: DC | PRN

## 2024-07-22 ENCOUNTER — Other Ambulatory Visit: Payer: Self-pay | Admitting: Internal Medicine

## 2024-08-02 ENCOUNTER — Other Ambulatory Visit: Payer: Self-pay | Admitting: Internal Medicine

## 2024-08-07 NOTE — Progress Notes (Unsigned)
 Office Visit    Patient Name: Stefanie Ferguson Date of Encounter: 08/08/2024  PCP:  Thurmond Cathlyn LABOR., MD   [facility]  Cardiologist:  Vina Gull, MD  Advanced Practice Provider:  No care team member to display Electrophysiologist:  None   HPI    Stefanie Ferguson is a 89 y.o. female with a past medical history significant for PAF (CHA2DS2-VASc score of 5), hypertension, hyperlipidemia, RAS, CV disease, FMD, PVCs, and normal cardiac cath 2007 presents today for follow-up appointment.  She was seen by Dr. Gull January 2023.  BP was noted to be running a little low and the patient felt dizzy at times.  Cut lisinopril  down to 5 mg and eventually stop lisinopril .  She was taking amlodipine  for SBP over 140s.  Most of her blood pressures were 120s to 140s when she was last seen by Dr. Gull 02/21/2022.  She denied chest pain, shortness of breath, and dizziness.  No palpitations.  She was seen by me Dec 2023, she feels like she has been overall doing okay.  She takes her Norvasc  about 3 days a week when her blood pressure is greater than 160 systolic.  She stopped her Crestor  because she felt like her numbers were doing well and she did not want to take it anymore.  She remains on fish oil.  She is asking about taking ibuprofen with the other blood thinner that she is on (Eliquis ) I suggested Tylenol instead however, if she absolutely has to take it every once in a while she can.  She understands that her bleeding risk would be higher during these times.  We discussed repeating a lipid panel in March since she has been off her Crestor .  She has had 3 nosebleeds in the last week or so but it stopped right away when she put a clamp on it.  She also shares that she has had neck pain and headaches that are pretty constant.  She seen a neurosurgeon and had a brain scan.  She is currently being worked up for this.  Asymptomatic with her atrial fibrillation.  She then saw Dr. Barbaraann in  March with a chief complaint of dizziness.  Blood pressure was variable, higher in the evenings.  Denied chest pain shortness of breath.  Gets a little dizzy when her blood pressure was elevated.  Ultimately, lisinopril  dose was increased.   She was seen by me 04/2023, she tells me that she has been doing well from a heart standpoint.  She has however been dealing with a lot of left leg pain.  Also has chronic neck pain.  She takes Tylenol for arthritis which does help.  She also uses heat on her left leg which helps.  Possible sciatica?  Blood pressure is well-controlled today.  She is compliant with all medications.  She has been taking her amlodipine  as needed and we will reflect this in her medications.  Reports no shortness of breath nor dyspnea on exertion. Reports no chest pain, pressure, or tightness. No edema, orthopnea, PND. Reports no palpitations.   Today, she presents with a hx of paroxysmal atrial fibrillation after a fall with concerns about potential internal injuries.  She fell last night while carrying a tray of food, tripped on the carpet, and landed on an electric heater. She has flank pain and is worried about internal bleeding or kidney injury. She tried to be seen at urgent care but could not get imaging. She has  not seen blood in her urine.  She has paroxysmal atrial fibrillation with a CHADS-VASC score of five and takes Eliquis  chronically. Hypertension is treated with lisinopril  10 mg and amlodipine  2.5 mg. She has hyperlipidemia and is not taking a statin. Recent lipids were total cholesterol 169, triglycerides 125, HDL 53, and LDL 93. She is not known to have coronary artery disease.  She has bilateral carotid artery stenosis. The last carotid ultrasound in January 2023 showed mild carotid plaque. A repeat study was recommended for January 2025 but has not been done. She is reluctant to have further testing, influenced by her daughter, who is a engineer, civil (consulting).  She has neck pain and  spasms treated as needed with tizanidine and Tylenol.  She has renal artery stenosis and is concerned about kidney function after the fall. Recent labs showed good kidney function, and she has not noticed hematuria.  She has leg and hip pain that worsens with prolonged sitting and improves with movement, which she attributes to arthritis. 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  Reports no shortness of breath nor dyspnea on exertion. Reports no chest pain, pressure, or tightness. No edema, orthopnea, PND. Reports no palpitations.   Discussed the use of AI scribe software for clinical note transcription with the patient, who gave verbal consent to proceed.  Past Medical History    Past Medical History:  Diagnosis Date   Chest pain    normal coronary agniography in 2006 or 2007   Heart murmur    Heart palpitations    caused by PVC's   Hx of echocardiogram 2015   Echo (12/2013):  Mild LVH, EF 55%, no RWMA, Gr 1 DD, mild MR, mild LAE, normal RVF, PASP 23 mmHg   Hypertension    Premature ventricular contractions    Renal artery stenosis    Symptomatic PVCs    with palpitations   Past Surgical History:  Procedure Laterality Date   BREAST SURGERY  2011   excision papilloma left breast   CARDIAC CATHETERIZATION     CHOLECYSTECTOMY OPEN  1988   CYSTOCELE REPAIR  2009   and Rectocele   ROTATOR CUFF REPAIR  1996/2000   bilateral   TUBAL LIGATION  1988   VAGINAL HYSTERECTOMY  2009    Allergies  Allergies  Allergen Reactions   Bupivacaine     Other reaction(s): Other (See Comments) Inability to speak clearly and just felt off Inability to speak clearly and just felt off    Dexamethasone      Other reaction(s): Other (See Comments)    EKGs/Labs/Other Studies Reviewed:   The following studies were reviewed today: Zio patch monitor 06/2019 SInus rhythm  53 to 154 bpm  Average 73 bpm Rare PAC, PVC. NO significant pauses.   Carotid US  10/15/2014 Bilateral  ICA 40-59 Repeat 2 years   Echocardiogram 01/23/2014 Mild LVH, EF 55, GR 1 DD, trivial AI, mild MR, mild LAE, normal RVSF, PASP 23   Event monitor 12/2013 Sinus rhythm with intermittent atrial fibrillation   Carotid US  (06/2013): Bilateral 40-59% ICA - f/u 1 year   Renal Art US  (06/2013) bilateral 1-59% (c/w FMD)  EKG:  EKG is not ordered today.    Recent Labs: No results found for requested labs within last 365 days.  Recent Lipid Panel    Component Value Date/Time   CHOL 138 11/24/2021 1034   TRIG 57 11/24/2021 1034   HDL 60 11/24/2021 1034   CHOLHDL 2.3 11/24/2021 1034  CHOLHDL 2.6 12/31/2015 1018   VLDL 21 12/31/2015 1018   LDLCALC 66 11/24/2021 1034    Risk Assessment/Calculations:    Home Medications   Current Meds  Medication Sig   acetaminophen (TYLENOL) 325 MG tablet Take 650 mg by mouth every 6 (six) hours as needed for mild pain.    ALPRAZolam (XANAX) 0.25 MG tablet Take 1 tablet by mouth as needed. Per patient taking 1/2 tablet as needed   conjugated estrogens (PREMARIN) vaginal cream Place 0.5 mg vaginally 3 (three) times a week.   OVER THE COUNTER MEDICATION Place 1 drop into both eyes daily. Similason pt. Instructed to put 1 drop in both eyes daily.   Propylene Glycol, PF, (SYSTANE COMPLETE PF) 0.6 % SOLN Apply 1 drop to eye daily.   tiZANidine (ZANAFLEX) 2 MG tablet Take 2 mg by mouth every 8 (eight) hours as needed.   [DISCONTINUED] acebutolol  (SECTRAL ) 200 MG capsule Take 1 capsule (200 mg total) by mouth daily.   [DISCONTINUED] amLODipine  (NORVASC ) 2.5 MG tablet Take 1 tablet (2.5 mg total) by mouth daily as needed (for systolic greater than 155).   [DISCONTINUED] apixaban  (ELIQUIS ) 2.5 MG TABS tablet Take 1 tablet (2.5 mg total) by mouth 2 (two) times daily.   [DISCONTINUED] lisinopril  (ZESTRIL ) 10 MG tablet Take 1 tablet (10 mg total) by mouth daily. PLEASE MAKE APPOINTMENT IN ORDER TO RECEIVE ADDITIONAL REFILLS, FIRST ATTEMPT.     Review of  Systems      All other systems reviewed and are otherwise negative except as noted above.  Physical Exam    VS:  BP (!) 153/81 Comment: patient automatic cuff  Pulse 72   Ht 5' 3 (1.6 m)   Wt 118 lb (53.5 kg)   SpO2 98%   BMI 20.90 kg/m  , BMI Body mass index is 20.9 kg/m.  Wt Readings from Last 3 Encounters:  08/08/24 118 lb (53.5 kg)  05/21/23 117 lb (53.1 kg)  11/10/22 122 lb 9.6 oz (55.6 kg)     GEN: Well nourished, well developed, in no acute distress. HEENT: normal. Neck: Supple, no JVD, carotid bruits, or masses. Cardiac: RRR, no murmurs, rubs, or gallops. No clubbing, cyanosis, edema.  Radials/PT 2+ and equal bilaterally.  Respiratory:  Respirations regular and unlabored, clear to auscultation bilaterally. GI: Soft, nontender, nondistended. MS: No deformity or atrophy. Skin: Warm and dry, no rash. Right flank lump, possible hematoma?  Neuro:  Strength and sensation are intact. Psych: Normal affect.  Assessment & Plan    Evaluation of fall with flank pain Recent fall with flank pain, concern for internal bleeding or kidney injury. No visible bruising or hematuria. Pain localized to flank, possibly soft tissue or kidney involvement. - Ordered CT scan of chest, abdomen, and pelvis to evaluate for internal bleeding or kidney injury. - Checked kidney function. - Coordinated CT scan at a facility close to home.  Paroxysmal atrial fibrillation CHADS-VASc score of 5, managed with Eliquis . Discussed bleeding risks with surgery due to anticoagulation. - Continue Eliquis  therapy.  Essential hypertension On lisinopril  and amlodipine . - Continue lisinopril  10 mg daily. - Continue amlodipine  2.5 mg daily.  Mixed hyperlipidemia Lipid panel within target range. LDL goal <100 mg/dL. She self-discontinued Crestor , continues fish oil. - Continue fish oil supplementation.  Renal artery stenosis Previous good kidney function. Recent fall raises concern for kidney injury. -  Checked kidney function. - Ordered CT scan to evaluate for kidney injury.  Bilateral carotid artery stenosis Mild carotid plaquing noted. She  declined ultrasound. Discussed stroke risks and monitoring importance. - Deferred ultrasound of carotid arteries due to absence of bruit.  Premature ventricular contractions -not addressed    Disposition: Follow up 1 year with Vina Gull, MD or APP.  Signed, Orren LOISE Fabry, PA-C 08/08/2024, 5:01 PM [facility]

## 2024-08-08 ENCOUNTER — Ambulatory Visit: Attending: Physician Assistant | Admitting: Physician Assistant

## 2024-08-08 ENCOUNTER — Encounter: Payer: Self-pay | Admitting: Physician Assistant

## 2024-08-08 VITALS — BP 153/81 | HR 72 | Ht 63.0 in | Wt 118.0 lb

## 2024-08-08 DIAGNOSIS — I10 Essential (primary) hypertension: Secondary | ICD-10-CM | POA: Diagnosis not present

## 2024-08-08 DIAGNOSIS — I493 Ventricular premature depolarization: Secondary | ICD-10-CM | POA: Diagnosis not present

## 2024-08-08 DIAGNOSIS — I480 Paroxysmal atrial fibrillation: Secondary | ICD-10-CM

## 2024-08-08 DIAGNOSIS — E782 Mixed hyperlipidemia: Secondary | ICD-10-CM | POA: Diagnosis not present

## 2024-08-08 DIAGNOSIS — Z79899 Other long term (current) drug therapy: Secondary | ICD-10-CM | POA: Diagnosis not present

## 2024-08-08 MED ORDER — AMLODIPINE BESYLATE 2.5 MG PO TABS: 2.5000 mg | ORAL_TABLET | Freq: Every day | ORAL | 1 refills | Status: AC | PRN

## 2024-08-08 MED ORDER — ACEBUTOLOL HCL 200 MG PO CAPS: 200.0000 mg | ORAL_CAPSULE | Freq: Every day | ORAL | 3 refills | Status: AC

## 2024-08-08 MED ORDER — LISINOPRIL 10 MG PO TABS: 10.0000 mg | ORAL_TABLET | Freq: Every day | ORAL | 3 refills | Status: AC

## 2024-08-08 MED ORDER — APIXABAN 2.5 MG PO TABS: 2.5000 mg | ORAL_TABLET | Freq: Two times a day (BID) | ORAL | 6 refills | Status: AC

## 2024-08-08 NOTE — Patient Instructions (Addendum)
 Medication Instructions:  Your physician recommends that you continue on your current medications as directed. Please refer to the Current Medication list given to you today. *If you need a refill on your cardiac medications before your next appointment, please call your pharmacy*  Lab Work: TODAY-BMET  If you have labs (blood work) drawn today and your tests are completely normal, you will receive your results only by: MyChart Message (if you have MyChart) OR A paper copy in the mail If you have any lab test that is abnormal or we need to change your treatment, we will call you to review the results.  Testing/Procedures: CT of the CHEST ABDOMEN AND PELVIC  Follow-Up: At [facility], you and your health needs are our priority.  As part of our continuing mission to provide you with exceptional heart care, our providers are all part of one team.  This team includes your primary Cardiologist (physician) and Advanced Practice Providers or APPs (Physician Assistants and Nurse Practitioners) who all work together to provide you with the care you need, when you need it.  Your next appointment:   12 month(s)  Provider:   Vina Gull, MD or Orren Fabry, [place]   We recommend signing up for the patient portal called MyChart.  Sign up information is provided on this After Visit Summary.  MyChart is used to connect with patients for Virtual Visits (Telemedicine).  Patients are able to view lab/test results, encounter notes, upcoming appointments, etc.  Non-urgent messages can be sent to your provider as well.   To learn more about what you can do with MyChart, go to [email or web address].   Other Instructions You have been referred to Dr Mavis a Neurosurgeon.

## 2024-08-09 LAB — BASIC METABOLIC PANEL WITH GFR
BUN/Creatinine Ratio: 23 (ref 12–28)
BUN: 17 mg/dL (ref 8–27)
CO2: 25 mmol/L (ref 20–29)
Calcium: 9.8 mg/dL (ref 8.7–10.3)
Chloride: 101 mmol/L (ref 96–106)
Creatinine, Ser: 0.74 mg/dL (ref 0.57–1.00)
Glucose: 91 mg/dL (ref 70–99)
Potassium: 4.1 mmol/L (ref 3.5–5.2)
Sodium: 141 mmol/L (ref 134–144)
eGFR: 78 mL/min/1.73 (ref >59)

## 2024-08-11 ENCOUNTER — Ambulatory Visit: Payer: Self-pay | Admitting: Physician Assistant

## 2024-08-11 NOTE — Telephone Encounter (Signed)
 Patient called to follow-up on her lab results and noted her computer is not working.

## 2024-08-12 ENCOUNTER — Inpatient Hospital Stay (HOSPITAL_BASED_OUTPATIENT_CLINIC_OR_DEPARTMENT_OTHER)
Admission: RE | Admit: 2024-08-12 | Discharge: 2024-08-12 | Attending: Physician Assistant | Admitting: Physician Assistant

## 2024-08-12 DIAGNOSIS — E782 Mixed hyperlipidemia: Secondary | ICD-10-CM | POA: Diagnosis not present

## 2024-08-12 DIAGNOSIS — I480 Paroxysmal atrial fibrillation: Secondary | ICD-10-CM | POA: Diagnosis not present

## 2024-08-12 DIAGNOSIS — I10 Essential (primary) hypertension: Secondary | ICD-10-CM | POA: Diagnosis not present

## 2024-08-12 DIAGNOSIS — I493 Ventricular premature depolarization: Secondary | ICD-10-CM

## 2024-08-12 DIAGNOSIS — Z79899 Other long term (current) drug therapy: Secondary | ICD-10-CM | POA: Diagnosis not present

## 2024-08-19 ENCOUNTER — Telehealth: Payer: Self-pay | Admitting: Internal Medicine

## 2024-08-19 NOTE — Telephone Encounter (Signed)
 Daughter Thomasine) is following-up on patient's CT test results.

## 2024-08-19 NOTE — Telephone Encounter (Signed)
 Spoke with Stefanie Ferguson, pt daughter (per [facility]) regarding CT test results. Will send to Dr. Okey and Orren Fabry, PA for review. Daughter verbalizes understanding that someone will reach out with plan and results when reviewed.

## 2024-08-20 NOTE — Telephone Encounter (Signed)
 Noted. Still not resulted at this time.

## 2024-08-25 NOTE — Telephone Encounter (Signed)
 CT shows fracture of 9th and 10th rib on the right   THis may be causing her pain  COuld have been caused by fall  Nothing to do but give time to heal    Forward to PCP

## 2024-08-26 NOTE — Telephone Encounter (Signed)
 Spoke with patient's daughter Verneita and shared results comment from Dr. Okey:  CT shows fracture of 9th and 10th rib on the right   THis may be causing her pain  COuld have been caused by fall  Nothing to do but give time to heal    Forward to PCP        Results forwarded to Dr. Thurmond for review.  Verneita verbalized understanding and states she wanted to make it known she is disappointed in how long it took for patient to get in for the CT scan and then how long after it took before they were called with results. Verneita thanked me for the follow-up call.
